# Patient Record
Sex: Female | Born: 1970 | Race: Black or African American | Hispanic: No | Marital: Single
Health system: Midwestern US, Community
[De-identification: ages and names within clinical notes are randomized; demographics above are authoritative.]

## PROBLEM LIST (undated history)

## (undated) DIAGNOSIS — G4730 Sleep apnea, unspecified: Secondary | ICD-10-CM

## (undated) DIAGNOSIS — R569 Unspecified convulsions: Secondary | ICD-10-CM

## (undated) DIAGNOSIS — G35 Multiple sclerosis: Secondary | ICD-10-CM

## (undated) DIAGNOSIS — F319 Bipolar disorder, unspecified: Secondary | ICD-10-CM

## (undated) DIAGNOSIS — F4310 Post-traumatic stress disorder, unspecified: Secondary | ICD-10-CM

## (undated) DIAGNOSIS — G43909 Migraine, unspecified, not intractable, without status migrainosus: Secondary | ICD-10-CM

## (undated) DIAGNOSIS — I10 Essential (primary) hypertension: Secondary | ICD-10-CM

## (undated) HISTORY — PX: BREAST REDUCTION SURGERY: SHX8

## (undated) HISTORY — PX: HEMORRHOID SURGERY: SHX153

## (undated) HISTORY — PX: ABDOMINAL HYSTERECTOMY: SHX81

---

## 2008-05-17 ENCOUNTER — Ambulatory Visit: Payer: Self-pay | Admitting: Family Medicine

## 2008-06-01 ENCOUNTER — Ambulatory Visit: Payer: Self-pay | Admitting: Family Medicine

## 2008-06-01 DIAGNOSIS — E669 Obesity, unspecified: Secondary | ICD-10-CM | POA: Insufficient documentation

## 2008-06-03 ENCOUNTER — Telehealth: Payer: Self-pay | Admitting: Family Medicine

## 2008-06-06 ENCOUNTER — Telehealth: Payer: Self-pay | Admitting: Family Medicine

## 2008-06-07 DIAGNOSIS — D509 Iron deficiency anemia, unspecified: Secondary | ICD-10-CM

## 2008-06-21 ENCOUNTER — Telehealth: Payer: Self-pay | Admitting: Family Medicine

## 2008-06-30 ENCOUNTER — Telehealth: Payer: Self-pay | Admitting: Family Medicine

## 2008-07-01 ENCOUNTER — Telehealth (INDEPENDENT_AMBULATORY_CARE_PROVIDER_SITE_OTHER): Payer: Self-pay | Admitting: *Deleted

## 2008-08-15 ENCOUNTER — Telehealth: Payer: Self-pay | Admitting: Family Medicine

## 2008-08-24 ENCOUNTER — Ambulatory Visit: Payer: Self-pay | Admitting: Family Medicine

## 2008-08-24 ENCOUNTER — Telehealth: Payer: Self-pay | Admitting: Family Medicine

## 2008-08-24 ENCOUNTER — Encounter: Payer: Self-pay | Admitting: Family Medicine

## 2008-08-24 DIAGNOSIS — G43909 Migraine, unspecified, not intractable, without status migrainosus: Secondary | ICD-10-CM | POA: Insufficient documentation

## 2008-08-24 DIAGNOSIS — F3132 Bipolar disorder, current episode depressed, moderate: Secondary | ICD-10-CM | POA: Insufficient documentation

## 2008-08-24 DIAGNOSIS — I10 Essential (primary) hypertension: Secondary | ICD-10-CM

## 2008-08-24 LAB — CONVERTED CEMR LAB
CO2: 23 meq/L (ref 19–32)
Calcium: 9.2 mg/dL (ref 8.4–10.5)
Chloride: 104 meq/L (ref 96–112)
Creatinine, Ser: 0.71 mg/dL (ref 0.40–1.20)
Hemoglobin: 11.9 g/dL — ABNORMAL LOW (ref 12.0–15.0)
Potassium: 3.9 meq/L (ref 3.5–5.3)
RDW: 14.7 % (ref 11.5–15.5)
Sodium: 139 meq/L (ref 135–145)
TSH: 0.992 microintl units/mL (ref 0.350–4.500)

## 2008-08-26 ENCOUNTER — Telehealth: Payer: Self-pay | Admitting: Family Medicine

## 2008-09-06 ENCOUNTER — Encounter: Payer: Self-pay | Admitting: Family Medicine

## 2008-09-06 ENCOUNTER — Telehealth: Payer: Self-pay | Admitting: Family Medicine

## 2008-09-08 ENCOUNTER — Ambulatory Visit: Payer: Self-pay | Admitting: Family Medicine

## 2008-09-16 ENCOUNTER — Telehealth: Payer: Self-pay | Admitting: Family Medicine

## 2008-09-19 ENCOUNTER — Telehealth: Payer: Self-pay | Admitting: Psychology

## 2008-09-19 ENCOUNTER — Encounter: Payer: Self-pay | Admitting: Family Medicine

## 2008-09-19 ENCOUNTER — Ambulatory Visit: Payer: Self-pay | Admitting: Family Medicine

## 2008-09-26 ENCOUNTER — Emergency Department (HOSPITAL_COMMUNITY): Admission: EM | Admit: 2008-09-26 | Discharge: 2008-09-26 | Payer: Self-pay | Admitting: Emergency Medicine

## 2008-09-26 ENCOUNTER — Telehealth: Payer: Self-pay | Admitting: Family Medicine

## 2008-09-28 ENCOUNTER — Telehealth (INDEPENDENT_AMBULATORY_CARE_PROVIDER_SITE_OTHER): Payer: Self-pay | Admitting: *Deleted

## 2008-09-28 ENCOUNTER — Encounter: Payer: Self-pay | Admitting: Family Medicine

## 2008-09-28 ENCOUNTER — Encounter (INDEPENDENT_AMBULATORY_CARE_PROVIDER_SITE_OTHER): Payer: Self-pay | Admitting: *Deleted

## 2008-09-29 ENCOUNTER — Encounter: Payer: Self-pay | Admitting: Family Medicine

## 2008-09-29 ENCOUNTER — Ambulatory Visit: Payer: Self-pay | Admitting: Family Medicine

## 2008-09-29 LAB — CONVERTED CEMR LAB: Lithium Lvl: <0.1 meq/L — ABNORMAL LOW (ref 0.80–1.40)

## 2008-10-10 ENCOUNTER — Telehealth: Payer: Self-pay | Admitting: Psychology

## 2008-10-11 ENCOUNTER — Telehealth: Payer: Self-pay | Admitting: Psychology

## 2008-10-17 ENCOUNTER — Telehealth (INDEPENDENT_AMBULATORY_CARE_PROVIDER_SITE_OTHER): Payer: Self-pay | Admitting: *Deleted

## 2008-10-18 ENCOUNTER — Telehealth (INDEPENDENT_AMBULATORY_CARE_PROVIDER_SITE_OTHER): Payer: Self-pay | Admitting: *Deleted

## 2008-11-03 ENCOUNTER — Ambulatory Visit: Payer: Self-pay | Admitting: Family Medicine

## 2008-11-03 ENCOUNTER — Encounter: Payer: Self-pay | Admitting: Family Medicine

## 2008-11-03 ENCOUNTER — Telehealth (INDEPENDENT_AMBULATORY_CARE_PROVIDER_SITE_OTHER): Payer: Self-pay | Admitting: *Deleted

## 2008-11-03 LAB — CONVERTED CEMR LAB: Lithium Lvl: 0.12 meq/L — ABNORMAL LOW (ref 0.80–1.40)

## 2008-11-04 ENCOUNTER — Telehealth: Payer: Self-pay | Admitting: Family Medicine

## 2008-11-04 ENCOUNTER — Encounter: Payer: Self-pay | Admitting: Family Medicine

## 2008-11-11 ENCOUNTER — Telehealth: Payer: Self-pay | Admitting: Family Medicine

## 2008-11-16 ENCOUNTER — Telehealth: Payer: Self-pay | Admitting: Family Medicine

## 2008-11-18 ENCOUNTER — Ambulatory Visit: Payer: Self-pay | Admitting: Family Medicine

## 2008-12-07 ENCOUNTER — Telehealth: Payer: Self-pay | Admitting: Family Medicine

## 2008-12-27 ENCOUNTER — Encounter: Payer: Self-pay | Admitting: Family Medicine

## 2008-12-27 ENCOUNTER — Ambulatory Visit: Payer: Self-pay | Admitting: Family Medicine

## 2009-01-17 ENCOUNTER — Encounter: Payer: Self-pay | Admitting: Family Medicine

## 2009-01-17 ENCOUNTER — Ambulatory Visit: Payer: Self-pay | Admitting: Family Medicine

## 2009-01-17 LAB — CONVERTED CEMR LAB: Lithium Lvl: 0.16 meq/L — ABNORMAL LOW (ref 0.80–1.40)

## 2009-01-24 ENCOUNTER — Encounter: Payer: Self-pay | Admitting: *Deleted

## 2009-01-27 ENCOUNTER — Telehealth: Payer: Self-pay | Admitting: *Deleted

## 2009-02-01 ENCOUNTER — Ambulatory Visit: Payer: Self-pay | Admitting: Family Medicine

## 2009-02-01 ENCOUNTER — Encounter: Payer: Self-pay | Admitting: Family Medicine

## 2009-02-01 DIAGNOSIS — G609 Hereditary and idiopathic neuropathy, unspecified: Secondary | ICD-10-CM | POA: Insufficient documentation

## 2009-02-01 LAB — CONVERTED CEMR LAB
ALT: 16 units/L (ref 0–35)
Albumin: 4 g/dL (ref 3.5–5.2)
CO2: 24 meq/L (ref 19–32)
Calcium: 9 mg/dL (ref 8.4–10.5)
Chloride: 104 meq/L (ref 96–112)
Creatinine, Ser: 0.84 mg/dL (ref 0.40–1.20)
Total Bilirubin: 0.2 mg/dL — ABNORMAL LOW (ref 0.3–1.2)

## 2009-02-02 ENCOUNTER — Telehealth: Payer: Self-pay | Admitting: *Deleted

## 2009-03-17 ENCOUNTER — Ambulatory Visit: Payer: Self-pay | Admitting: Family Medicine

## 2009-03-17 ENCOUNTER — Encounter: Payer: Self-pay | Admitting: Family Medicine

## 2009-03-17 LAB — CONVERTED CEMR LAB
ALT: 15 units/L (ref 0–35)
AST: 15 units/L (ref 0–37)
Albumin: 4.2 g/dL (ref 3.5–5.2)
Alkaline Phosphatase: 73 units/L (ref 39–117)
BUN: 13 mg/dL (ref 6–23)
Basophils Absolute: 0 10*3/uL (ref 0.0–0.1)
Blood in Urine, dipstick: NEGATIVE
Calcium: 8.9 mg/dL (ref 8.4–10.5)
Creatinine, Ser: 0.69 mg/dL (ref 0.40–1.20)
Eosinophils Absolute: 0.1 10*3/uL (ref 0.0–0.7)
Eosinophils Relative: 1 % (ref 0–5)
Glucose, Urine, Semiquant: NEGATIVE
HCT: 36.5 % (ref 36.0–46.0)
Hemoglobin: 11.4 g/dL — ABNORMAL LOW (ref 12.0–15.0)
MCV: 79.5 fL (ref 78.0–100.0)
Potassium: 4 meq/L (ref 3.5–5.3)
Protein, U semiquant: NEGATIVE
RDW: 15 % (ref 11.5–15.5)
TSH: 1.165 microintl units/mL (ref 0.350–4.500)
Total Protein: 7.1 g/dL (ref 6.0–8.3)
WBC: 7.6 10*3/uL (ref 4.0–10.5)

## 2009-03-21 ENCOUNTER — Telehealth: Payer: Self-pay | Admitting: *Deleted

## 2009-05-26 ENCOUNTER — Ambulatory Visit: Payer: Self-pay | Admitting: Family Medicine

## 2009-05-26 ENCOUNTER — Encounter: Payer: Self-pay | Admitting: Family Medicine

## 2009-05-26 LAB — CONVERTED CEMR LAB: Lithium Lvl: <0.1 meq/L — ABNORMAL LOW (ref 0.80–1.40)

## 2009-05-31 ENCOUNTER — Telehealth: Payer: Self-pay | Admitting: *Deleted

## 2009-06-05 ENCOUNTER — Encounter: Payer: Self-pay | Admitting: Family Medicine

## 2009-06-05 ENCOUNTER — Emergency Department (HOSPITAL_COMMUNITY): Admission: EM | Admit: 2009-06-05 | Discharge: 2009-06-05 | Payer: Self-pay | Admitting: Emergency Medicine

## 2009-07-05 ENCOUNTER — Telehealth: Payer: Self-pay | Admitting: Family Medicine

## 2009-07-07 ENCOUNTER — Telehealth: Payer: Self-pay | Admitting: Family Medicine

## 2009-08-19 ENCOUNTER — Emergency Department (HOSPITAL_COMMUNITY): Admission: EM | Admit: 2009-08-19 | Discharge: 2009-08-19 | Payer: Self-pay | Admitting: Family Medicine

## 2009-12-05 ENCOUNTER — Emergency Department (HOSPITAL_COMMUNITY)
Admission: EM | Admit: 2009-12-05 | Discharge: 2009-12-05 | Payer: Self-pay | Source: Home / Self Care | Admitting: Family Medicine

## 2010-01-02 ENCOUNTER — Ambulatory Visit: Payer: Self-pay | Admitting: Family Medicine

## 2010-01-02 DIAGNOSIS — R358 Other polyuria: Secondary | ICD-10-CM

## 2010-01-02 DIAGNOSIS — R3589 Other polyuria: Secondary | ICD-10-CM | POA: Insufficient documentation

## 2010-01-02 LAB — CONVERTED CEMR LAB
Blood in Urine, dipstick: NEGATIVE
Protein, U semiquant: NEGATIVE
Specific Gravity, Urine: >=1.03
Urobilinogen, UA: 0.2
pH: 5.5

## 2010-03-27 NOTE — Assessment & Plan Note (Signed)
Summary: f/u,df   Vital Signs:  Patient profile:   40 year old female Weight:      229.5 pounds Temp:     98.1 degrees F oral Pulse rate:   79 / minute Pulse rhythm:   regular BP sitting:   119 / 77  (right arm) Cuff size:   large  Vitals Entered By: Loralee Pacas CMA (March 17, 2009 10:37 AM)  Primary Care Provider:  Lequita Asal  MD  CC:  Nausea.  History of Present Illness: 40 y/o female here c/o Nausea/vomiting  HTN- bp stable. denies headaches, chest pain. continued leg pain. on metoprolol  bipolar disorder- not taking lithium appropriately. continues to have long periods of insomnia. works at [employer] some nights for extra money. denies SI/HI. has not presented to [facility] as previously discussed  peripheral neuropathy- w/u negative. symptoms improved but not resolved with neurontin.   Acute Visit History:      The patient complains of abdominal pain, diarrhea, nausea, and vomiting.  These symptoms began 2 weeks ago.  She denies chest pain, constipation, cough, fever, genitourinary symptoms, headache, and rash.  Other comments include: no known sick contacts. no hematemesis, coffee ground emesis, BRBPR, melena. no known GI disease. s/p hysterectomy. denies EtOH intake since New Years Eve. has gained 2 lbs since last visit.        Urine output has been normal.  There have been 3 episodes of vomiting in the past 24 hours.  She is not tolerating clear liquids.        Current Medications (verified): 1)  Metoprolol Tartrate 50 Mg Tabs (Metoprolol Tartrate) .... One Tab By Mouth Bid 2)  Lithium Carbonate 300 Mg Caps (Lithium Carbonate) .... One Tab By Mouth Two Times A Day, and Two Tabs Qhs 3)  Keppra 250 Mg Tabs (Levetiracetam) 4)  Gabapentin 300 Mg  Caps (Gabapentin) .... One Tab By Mouth At Bedtime X1 Week, Then Increase To One Tab By Mouth Two Times A Day X1 Week, Then Increase To One Tab By Mouth Tid 5)  Promethazine Hcl 25 Mg Tabs (Promethazine Hcl) .... 1/2 Tab To 1 Tab  By Mouth Q4-Q6 Hours As Needed For Nausea  Allergies (verified): 1)  ! Penicillin 2)  ! Aspirin 3)  ! * Latex  Physical Exam  General:  obese female NAD; vital signs reviewed.  Mouth:  MMM Lungs:  Normal respiratory effort, chest expands symmetrically. Lungs are clear to auscultation, no crackles or wheezes. Heart:  Normal rate and regular rhythm. S1 and S2 normal without gallop, murmur, click, rub or other extra sounds. Abdomen:  mild TTP diffusely. no rebound or guarding. +BS.  Pulses:  2+ distal pulses Extremities:  TTP of bilateral calves. no significant edema. no palpable cords bilaterally.  Psych:  Oriented X3, normally interactive, good eye contact, not anxious appearing, and not depressed appearing.     Impression & Recommendations:  Problem # 1:  NAUSEA AND VOMITING (ICD-787.01) Assessment New history inconsistent. will order labs. possibly etiologies are vast. phenergan as needed   Orders: Comp Met-[facility] [id number]) Urinalysis-[facility] (00000) CBC w/Diff-[facility] (09811) TSH-[facility] ([id number]) Lipase-[facility] ([id number]) Miscellaneous Lab Charge-[facility] (96295) [facility]- Est  Level 4 (28413)  Problem # 2:  PERIPHERAL NEUROPATHY (ICD-356.9) Assessment: Improved recommend neuro eval. 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 encouraged patient AGAIN to go  to behavioral health.   Orders: [facility]- Est  Level 4 (60454)  Problem # 4:  HYPERTENSION, BENIGN ESSENTIAL (ICD-401.1) Assessment: Unchanged at goal.   Her updated medication list for this problem includes:    Metoprolol Tartrate 50 Mg Tabs (Metoprolol tartrate) ..... One tab by mouth bid  Orders: [facility]- Est  Level 4 (09811) Prescriptions: METRONIDAZOLE 500 MG TABS (METRONIDAZOLE) one tab by mouth two times a day x7 days  #14 x 0   Entered  and Authorized by:   Lequita Asal  MD   Signed by:   Lequita Asal  MD on 03/21/2009   Method used:   Electronically to        [facility] #[id number]* (retail)       [address]       [place], [place]  [address]       Ph: [phone]       Fax: [phone]   RxID:   [id number] PROMETHAZINE HCL 25 MG TABS (PROMETHAZINE HCL) 1/2 tab to 1 tab by mouth q4-q6 hours as needed for nausea  #45 x 0   Entered and Authorized by:   Lequita Asal  MD   Signed by:   Lequita Asal  MD on 03/17/2009   Method used:   Electronically to        [facility] [phone]* (retail)       [address]       [place], [place]  [address]       Ph: [phone]       Fax: [phone]   RxID:   [id number]   Laboratory Results   Urine Tests  Date/Time Received: March 17, 2009 11:17 AM  Date/Time Reported: March 17, 2009 11:50 AM   Routine Urinalysis   Color: yellow Appearance: Clear Glucose: negative   (Normal Range: Negative) Bilirubin: negative   (Normal Range: Negative) Ketone: negative   (Normal Range: Negative) Spec. Gravity: >=1.030   (Normal Range: 1.003-1.035) Blood: negative   (Normal Range: Negative) pH: 5.5   (Normal Range: 5.0-8.0) Protein: negative   (Normal Range: Negative) Urobilinogen: 0.2   (Normal Range: 0-1) Nitrite: negative   (Normal Range: Negative) Leukocyte Esterace: trace   (Normal Range: Negative)  Urine Microscopic WBC/HPF: 1-5 Bacteria/HPF: 2+ Mucous/HPF: 2+ Epithelial/HPF: 15-25 Other: several clue cells present    Comments: ...............test performed by......Marland KitchenBonnie A. [place]az[place], MLS (ASCP)cm      Prevention & Chronic Care Immunizations   Influenza vaccine: Not documented    Tetanus booster: Not documented    Pneumococcal vaccine: Not documented  Other Screening   Pap smear: Not documented   Pap smear action/deferral: Not indicated S/P hysterectomy  (09/06/2008)   Smoking status: never  (01/17/2009)  Lipids    Total Cholesterol: Not documented   LDL: Not documented   LDL Direct: Not documented   HDL: Not documented   Triglycerides: Not documented  Hypertension   Last Blood Pressure: 119 / 77  (03/17/2009)   Serum creatinine: 0.84  (02/01/2009)   Serum potassium 4.3  (02/01/2009) CMP ordered     Hypertension flowsheet reviewed?: Yes   Progress toward BP goal: At goal  Self-Management Support :   Personal Goals (by the next clinic visit) :      Personal blood pressure goal: 140/90  (01/17/2009)   Hypertension self-management support: BP self-monitoring log, Written self-care plan  (01/17/2009)    Hypertension self-management support not done because: Good outcomes  (03/17/2009)

## 2010-03-27 NOTE — Progress Notes (Signed)
Summary: phn msg  Phone Note Call from Patient Call back at Home Phone [phone]   Caller: Patient Summary of Call: Needs to talk to someone about the referral she was given today.   Initial call taken by: Clydell Hakim,  Jul 05, 2009 11:12 AM  Follow-up for Phone Call        to [facility] Follow-up by: Gladstone Pih,  Jul 05, 2009 11:37 AM  Additional Follow-up for Phone Call Additional follow up Details #1::        patient reports she went to her appointment that was scheduled with neurologist today.  she was scheduled to see Dr. Tawanna Solo.    this appointment was scheduled after referral had been faxed to the neurologist office. when she got to appointment today their office realized that she was a previous patient of Dr. Terrace Arabia and would not allow her to Dr. Tawanna Solo. they rescheduled her with Dr. Terrace Arabia for later in the month. she does not want to see Dr. Terrace Arabia  because she never told her why she was having seizures , just told her to take the medicine.  will send message to Dr. Lanier Prude to please advise.  Additional Follow-up by: Theresia Lo RN,  Jul 05, 2009 12:08 PM    Additional Follow-up for Phone Call Additional follow up Details #2::    patient can call and ask to change providers. there isnt anything else we can do. since she was evaluated only once, would recommend she f/u with Dr. Terrace Arabia and address the issues that she had previous visit and ask for better explanations Follow-up by: Lequita Asal  MD,  Jul 05, 2009 12:16 PM  Additional Follow-up for Phone Call Additional follow up Details #3:: Details for Additional Follow-up Action Taken:  patient has already ask to change providers . she had to send in a letter but it was denied. Marland Kitchen gave patient message from Dr. Lanier Prude. RN strongly encouraged her to follow up with Dr. Pincus Sanes. she will consider and let us know. Additional Follow-up by: Theresia Lo RN,  Jul 05, 2009 12:23 PM

## 2010-03-27 NOTE — Assessment & Plan Note (Signed)
Summary: f/up,tcb   Vital Signs:  Patient profile:   40 year old female Weight:      228.9 pounds Temp:     98.3 degrees F oral Pulse rate:   90 / minute Pulse rhythm:   regular BP sitting:   137 / 90  (left arm) Cuff size:   large  Vitals Entered By: Loralee Pacas CMA (May 26, 2009 8:52 AM)  Primary Care Provider:  Lequita Asal  MD  CC:  f/u Bipolar disorder and HTN.  History of Present Illness: 40 y/o female here for f/u  HTN- bp stable. denies headaches, chest pain. continued leg pain. on metoprolol  bipolar disorder- not taking lithium appropriately. continues to have long periods of insomnia. works at [employer] some nights for extra money. denies SI/HI. has not presented to [facility] as previously discussed  ?seizures- seizure activity has decreased but still persists. last seizure 2 weeks ago. pt no loner driving. seen by [facility] and reports abnormal EEG. started on Keppra. has not been seen by neuro since initial eval. no consciousness of events. episodes per son with jerking movements of arms/legs. some confusion afterwards. sister with epilepsy.  Current Medications (verified): 1)  Metoprolol Tartrate 50 Mg Tabs (Metoprolol Tartrate) .... One Tab By Mouth Bid 2)  Lithium Carbonate 300 Mg Caps (Lithium Carbonate) .... One Tab By Mouth Two Times A Day, and Two Tabs Qhs 3)  Keppra 250 Mg Tabs (Levetiracetam) 4)  Gabapentin 300 Mg  Caps (Gabapentin) .... One Tab By Mouth At Bedtime X1 Week, Then Increase To One Tab By Mouth Two Times A Day X1 Week, Then Increase To One Tab By Mouth Tid 5)  Promethazine Hcl 25 Mg Tabs (Promethazine Hcl) .... 1/2 Tab To 1 Tab By Mouth Q4-Q6 Hours As Needed For Nausea  Allergies (verified): 1)  ! Penicillin 2)  ! Aspirin 3)  ! 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  She lives with her 29 year old son, and 43 year old daughter Andreal, [place]).  She enjoys exercising, traveling, and dancing.  Physical Exam  General:  obese female NAD; vital signs reviewed.  Mouth:  MMM Lungs:  Normal respiratory effort, chest expands symmetrically. Lungs are clear to auscultation, no crackles or wheezes. Heart:  Normal rate and regular rhythm. S1 and S2 normal without gallop, murmur, click, rub or other extra sounds. Pulses:  2+ distal pulses Extremities:  no edema of BLE Neurologic:  alert & oriented X3, cranial nerves II-XII intact, strength normal in all extremities, and gait normal.   Psych:  Oriented X3, normally interactive, good eye contact, and not depressed appearing.     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 concerned patient still not taking medications appropriately. recommend, again, eval by psychiatry.   Orders: [facility]- Est  Level 4 (82956)  Problem # 2:  HYPERTENSION, BENIGN ESSENTIAL (ICD-401.1) Assessment: Unchanged no changes.   Her updated medication list for this problem includes:    Metoprolol Tartrate 100 Mg Tabs (Metoprolol tartrate) ..... One tab by mouth bid  Orders: [facility]- Est  Level 4 (21308)  Problem # 3:  ? of SEIZURE DISORDER (ICD-780.39) Assessment: Unchanged check keppra level. will refer back to neuro.   Her updated medication list for this problem includes:    Keppra 250 Mg Tabs (  Levetiracetam)    Gabapentin 300 Mg Caps (Gabapentin) ..... One tab by mouth at bedtime x1 week, then increase to one tab by mouth two times a day x1 week, then increase to one tab by mouth tid  Orders: Neurology Referral (Neuro) [facility]- Est  Level 4 (13086)  Complete Medication List: 1)  Metoprolol Tartrate 100 Mg Tabs (Metoprolol tartrate) .... One tab by mouth bid 2)  Lithium Carbonate 300 Mg Caps (Lithium carbonate) .... One tab by mouth two times a day, and two tabs qhs 3)  Keppra 250 Mg Tabs  (Levetiracetam) 4)  Gabapentin 300 Mg Caps (Gabapentin) .... One tab by mouth at bedtime x1 week, then increase to one tab by mouth two times a day x1 week, then increase to one tab by mouth tid 5)  Promethazine Hcl 25 Mg Tabs (Promethazine hcl) .... 1/2 tab to 1 tab by mouth q4-q6 hours as needed for nausea  Other Orders: Miscellaneous Lab Charge-[facility] (57846) Miscellaneous Lab Charge-[facility] [phone]) Prescriptions: METOPROLOL TARTRATE 100 MG TABS (METOPROLOL TARTRATE) one tab by mouth bid  #60 x 3   Entered and Authorized by:   Lequita Asal  MD   Signed by:   Lequita Asal  MD on 05/26/2009   Method used:   Electronically to        [facility] [phone]* (retail)       [address]       [place], [place]  [address]       Ph: [phone]       Fax: [phone]   RxID:   [id number]    Prevention & Chronic Care Immunizations   Influenza vaccine: Not documented    Tetanus booster: Not documented    Pneumococcal vaccine: Not documented  Other Screening   Pap smear: Not documented   Pap smear action/deferral: Not indicated S/P hysterectomy  (09/06/2008)   Smoking status: never  (01/17/2009)  Lipids   Total Cholesterol: Not documented   LDL: Not documented   LDL Direct: Not documented   HDL: Not documented   Triglycerides: Not documented  Hypertension   Last Blood Pressure: 137 / 90  (05/26/2009)   Serum creatinine: 0.69  (03/17/2009)   Serum potassium 4.0  (03/17/2009)    Hypertension flowsheet reviewed?: Yes   Progress toward BP goal: At goal  Self-Management Support :   Personal Goals (by the next clinic visit) :      Personal blood pressure goal: 140/90  (01/17/2009)   Patient will work on the following items until the next clinic visit to reach self-care goals:     Medications and monitoring: take my medicines every day  (05/26/2009)     Eating: eat foods that are low in salt  (05/26/2009)     Activity: take a 30 minute walk every day   (05/26/2009)    Hypertension self-management support: BP self-monitoring log, Written self-care plan  (01/17/2009)    Hypertension self-management support not done because: Good outcomes  (05/26/2009)

## 2010-03-27 NOTE — Assessment & Plan Note (Signed)
Summary: bipolar disorder   Vital Signs:  Patient profile:   40 year old female Height:      67 inches Weight:      220.13 pounds BMI:     34.60 BSA:     2.11 Temp:     98.6 degrees F Pulse rate:   86 / minute BP sitting:   118 / 78  Vitals Entered By: Delora Fuel (January 02, 2010 10:42 AM) CC: mulitiple issues Is Patient Diabetic? No Pain Assessment Patient in pain? yes     Location: all over Intensity: 5   Primary Care Provider:  Lequita Asal  MD  CC:  mulitiple issues.  History of Present Illness: This patient came in today for refill on her Lithium, she gives a very sketchy history regarding who has been prescribing this for her, on one occasion said our office, on another she said a psychiatrist in the [place] system (she is a reserve Administrator, [employer]).  She is also upset that she was not scheduled with a MD. On review of her records, she last had Lithium filled here one year ago, with QS for one month and one refill.  Her levels in the past indicated poor adhearance.  Her past MD indicated in her summary a similar pattern.  She has a history of seizures and did not get along with her neurologist and did not pay her bills per her records.  She is on two anticonvulsants that have also not been refilled by our office.  Today I discussed with her the importance of her seeing a psychiatrist to diagnose and follow her mediations.  That I was willing to refill her anticonvulsants but not her LIthium, as I got a urine on her and she was very dehydrated.  I wanted to draw labs and she refused saying that she would go to another doctor.  She was offered names of psychiatrists.  Habits & Providers  Alcohol-Tobacco-Diet     Alcohol drinks/day: <1     Alcohol Counseling: not indicated; patient does not drink     Alcohol type: wine     >5/day in last 3 mos: no     Feels need to cut down: no     Feels annoyed by complaints: no     Feels guilty re: drinking: no     Needs 'eye opener'  in am: no     Tobacco Status: never     Tobacco Counseling: not indicated; no tobacco use  Exercise-Depression-Behavior     Have you felt down or hopeless? yes     Have you felt little pleasure in things? yes     Depression Counseling: further diagnostic testing and/or other treatment is indicated  Allergies: 1)  ! Penicillin 2)  ! Aspirin 3)  ! 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  She works part-time at a [employer] given her frequent insomnia. She lives with her 34 year old son, and 92 year old daughter Lyrah, [place]).  She enjoys exercising, traveling, and dancing.  Physical Exam  General:  Patient was alert and well groomed, she was upset when I refused to refill her lithium without psychiatric referral.     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  She reported 2 weeks of mania, and past 2 days of depression.  Her urine was very concentrated, with concern for DI or other causes of dehydration which would cause lithium toxicity.  I did not feel that it was safe to practice ouside my scope, she has excellent insurance, she refused blood work to work up for [employer], refused a referral, and left angry. Orders: Miscellaneous Lab Charge-[facility] [id number]) [facility]- Est Level  3 (60454)  Complete Medication List: 1)  Metoprolol Tartrate 100 Mg Tabs (Metoprolol tartrate) .... One tab by mouth bid 2)  Lithium Carbonate 300 Mg Caps (Lithium carbonate) .... One tab by mouth two times a day, and two tabs qhs 3)  Keppra 250 Mg Tabs (Levetiracetam) 4)  Gabapentin 300 Mg Caps (Gabapentin) .... One tab by mouth at bedtime x1 week, then increase to one tab by mouth two times a day x1 week, then  increase to one tab by mouth tid 5)  Promethazine Hcl 25 Mg Tabs (Promethazine hcl) .... 1/2 tab to 1 tab by mouth q4-q6 hours as needed for nausea  Other Orders: Urinalysis-[facility] (00000)   Orders Added: 1)  Urinalysis-[facility] [00000] 2)  Miscellaneous Lab Charge-[facility] [99999] 3)  [facility]- Est Level  3 [99213]    Laboratory Results   Urine Tests  Date/Time Received: January 02, 2010 10:55 AM  Date/Time Reported: January 02, 2010 11:06 AM   Routine Urinalysis   Color: yellow Appearance: Clear Glucose: negative   (Normal Range: Negative) Bilirubin: negative   (Normal Range: Negative) Ketone: negative   (Normal Range: Negative) Spec. Gravity: >=1.030   (Normal Range: 1.003-1.035) Blood: negative   (Normal Range: Negative) pH: 5.5   (Normal Range: 5.0-8.0) Protein: negative   (Normal Range: Negative) Urobilinogen: 0.2   (Normal Range: 0-1) Nitrite: negative   (Normal Range: Negative) Leukocyte Esterace: negative   (Normal Range: Negative)    Comments: ...............test performed by......Marland KitchenBonnie A. Swazil[place], MLS (ASCP)cm

## 2010-03-27 NOTE — Letter (Signed)
Summary: Out of Work  [facility]  [address]   [place], [place] [address]   Phone: [phone]  Fax: [phone]    March 17, 2009   Employee:  [name]    To Whom It May Concern:   For Medical reasons, the above named employee should not participate in activities on the gun range.  If you need additional information, please feel free to contact our office.         Sincerely,    Lequita Asal  MD

## 2010-03-27 NOTE — Letter (Signed)
Summary: Work Excuse  [facility]  [address]   [place], [place] [address]   Phone: [phone]  Fax: [phone]    Today's Date: May 26, 2009  Name of Patient: Tina Wright  The above named patient had a medical visit today at:  9 am  Please take this into consideration when reviewing the time away from work/school.    Special Instructions:  [  ] None  [  ] To be off the remainder of today, returning to the normal work / school schedule tomorrow.  [  ] To be off until the next scheduled appointment on ______________________.  [x ] Other __NO PT OR FIRING UNTIL CLEARED BY NEUROLOGIST                          ________________________________________________________________________   Sincerely yours,   Lequita Asal  MD

## 2010-03-27 NOTE — Progress Notes (Signed)
Summary: pls call  Phone Note Call from Patient Call back at Home Phone [phone]   Caller: Patient Summary of Call: needs to talk to Dr Lanier Prude - it's very important Initial call taken by: De Nurse,  Jul 07, 2009 3:40 PM  Follow-up for Phone Call        "having a meltdown." unable to present to [facility] because she is concerned about son and can't leave him and he has EOGs. reports suicidal ideation without plan. advised again to present to [facility] and she states she contracts for safety and will call someone if she has a plan to hurt herself.  Follow-up by: Lequita Asal  MD,  Jul 07, 2009 3:44 PM

## 2010-03-27 NOTE — Progress Notes (Signed)
  Phone Note Outgoing Call   Call placed by: Lequita Asal  MD,  May 31, 2009 12:33 PM Summary of Call: sent gel for daughters acne to [facility] [address]. please inform.  Initial call taken by: Lequita Asal  MD,  May 31, 2009 12:33 PM      pt notified.Loralee Pacas CMA  June 02, 2009 2:17 PM

## 2010-03-27 NOTE — Progress Notes (Signed)
Summary: normal labs  Phone Note Outgoing Call   Call placed by: Lequita Asal  MD,  March 21, 2009 1:10 PM Summary of Call: please inform labs normal, except for bacterial vaginosis on U/A. rx for flagyl sent.  Initial call taken by: Lequita Asal  MD,  March 21, 2009 1:10 PM    called pt and informed her of her results and told her that she can go pick up her Rx at [facility] [address].Loralee Pacas CMA  March 21, 2009 5:07 PM

## 2010-03-27 NOTE — Miscellaneous (Signed)
Summary: re: neuro appointment  Clinical Lists Changes   attempted to schedule appointment with [facility]. patient has a balance there and will need to contact them  before she can be seen. patient notified.  ask her to call me back when this has been done so I can proceed with referral. Theresia Lo RN  June 05, 2009 3:10 PM

## 2010-04-05 ENCOUNTER — Encounter: Payer: Self-pay | Admitting: *Deleted

## 2010-05-16 LAB — LITHIUM LEVEL: Lithium Lvl: <0.25 mEq/L — ABNORMAL LOW (ref 0.80–1.40)

## 2010-05-16 LAB — COMPREHENSIVE METABOLIC PANEL
ALT: 13 U/L (ref 0–35)
Alkaline Phosphatase: 58 U/L (ref 39–117)
BUN: 10 mg/dL (ref 6–23)
GFR calc Af Amer: >60 mL/min (ref >60)
GFR calc non Af Amer: >60 mL/min (ref >60)
Potassium: 3.8 mEq/L (ref 3.5–5.1)

## 2010-05-16 LAB — CBC
HCT: 35.3 % — ABNORMAL LOW (ref 36.0–46.0)
RBC: 4.46 MIL/uL (ref 3.87–5.11)
RDW: 14.7 % (ref 11.5–15.5)
WBC: 7.1 10*3/uL (ref 4.0–10.5)

## 2010-05-16 LAB — TROPONIN I: Troponin I: 0.01 ng/mL (ref 0.00–0.06)

## 2010-06-02 LAB — DIFFERENTIAL
Basophils Relative: 0 % (ref 0–1)
Eosinophils Absolute: 0.1 10*3/uL (ref 0.0–0.7)
Eosinophils Relative: 1 % (ref 0–5)
Monocytes Absolute: 0.4 10*3/uL (ref 0.1–1.0)
Neutrophils Relative %: 59 % (ref 43–77)

## 2010-06-02 LAB — COMPREHENSIVE METABOLIC PANEL
ALT: 17 U/L (ref 0–35)
AST: 19 U/L (ref 0–37)
Albumin: 3.5 g/dL (ref 3.5–5.2)
BUN: 9 mg/dL (ref 6–23)
CO2: 27 mEq/L (ref 19–32)
Calcium: 8.7 mg/dL (ref 8.4–10.5)
Creatinine, Ser: 0.71 mg/dL (ref 0.4–1.2)
GFR calc non Af Amer: >60 mL/min (ref >60)
Potassium: 3.7 mEq/L (ref 3.5–5.1)

## 2010-06-02 LAB — RAPID URINE DRUG SCREEN, HOSP PERFORMED
Amphetamines: NOT DETECTED
Cocaine: NOT DETECTED
Opiates: NOT DETECTED
Tetrahydrocannabinol: NOT DETECTED

## 2010-06-02 LAB — CBC
MCHC: 32.8 g/dL (ref 30.0–36.0)
Platelets: 285 10*3/uL (ref 150–400)
RBC: 4.13 MIL/uL (ref 3.87–5.11)

## 2010-07-25 ENCOUNTER — Encounter: Payer: Self-pay | Admitting: Family Medicine

## 2010-07-25 ENCOUNTER — Ambulatory Visit (INDEPENDENT_AMBULATORY_CARE_PROVIDER_SITE_OTHER): Payer: Self-pay | Admitting: Family Medicine

## 2010-07-25 DIAGNOSIS — Z202 Contact with and (suspected) exposure to infections with a predominantly sexual mode of transmission: Secondary | ICD-10-CM

## 2010-07-25 DIAGNOSIS — N760 Acute vaginitis: Secondary | ICD-10-CM

## 2010-07-25 LAB — POCT WET PREP (WET MOUNT)

## 2010-07-25 NOTE — Patient Instructions (Signed)
Follow up as needed.  I will call with results.

## 2010-07-25 NOTE — Progress Notes (Signed)
  Subjective:    Patient ID: Tina Wright, female    DOB: 04/15/1970, 40 y.o.   MRN: [id number]  HPI  1) STD test: Boyfriend's mistress called on Sunday to state that she had Chlamydia and had contracted it from patient's boyfriend. Reports some whitish discharge intermittently without odor. Denies dysuria, hematuria, pelvic pain, nausea, emesis, fever, chills, weight loss.    Review of Systems As per HPI     Objective:   Physical Exam  Constitutional: She appears well-developed and well-nourished.  Abdominal: Soft. Bowel sounds are normal. There is no tenderness. There is no rebound and no guarding.  Genitourinary: Cervix exhibits discharge. Cervix exhibits no motion tenderness. Vaginal discharge found.          Assessment & Plan:

## 2010-07-25 NOTE — Assessment & Plan Note (Addendum)
Declines testing for HIV, syphilis, hepatitis. Wet prep, GC/CT done today. Will call patient with results - negative for trichomonas, GC/CT. Positive for bacterial vaginosis. Will send in treatment - Flagyl BID. Called patient to inform.

## 2010-07-26 MED ORDER — METRONIDAZOLE 500 MG PO TABS: 500.0000 mg | ORAL_TABLET | Freq: Two times a day (BID) | ORAL | Status: AC

## 2010-07-26 NOTE — Progress Notes (Signed)
Addended by: Gennette Pac on: 07/26/2010 02:04 PM   Modules accepted: Orders

## 2010-10-30 ENCOUNTER — Emergency Department (HOSPITAL_COMMUNITY): Payer: Self-pay

## 2010-10-30 ENCOUNTER — Emergency Department (HOSPITAL_COMMUNITY)
Admission: EM | Admit: 2010-10-30 | Discharge: 2010-10-30 | Disposition: A | Discharge status: Home / Self Care | Payer: Self-pay | Attending: Emergency Medicine | Admitting: Emergency Medicine

## 2010-10-30 DIAGNOSIS — D649 Anemia, unspecified: Secondary | ICD-10-CM | POA: Insufficient documentation

## 2010-10-30 DIAGNOSIS — S5010XA Contusion of unspecified forearm, initial encounter: Secondary | ICD-10-CM | POA: Insufficient documentation

## 2010-10-30 DIAGNOSIS — G40909 Epilepsy, unspecified, not intractable, without status epilepticus: Secondary | ICD-10-CM | POA: Insufficient documentation

## 2010-10-30 DIAGNOSIS — W1809XA Striking against other object with subsequent fall, initial encounter: Secondary | ICD-10-CM | POA: Insufficient documentation

## 2010-10-30 DIAGNOSIS — I10 Essential (primary) hypertension: Secondary | ICD-10-CM | POA: Insufficient documentation

## 2010-11-06 ENCOUNTER — Emergency Department (HOSPITAL_COMMUNITY)
Admission: EM | Admit: 2010-11-06 | Discharge: 2010-11-06 | Disposition: A | Discharge status: Home / Self Care | Payer: Self-pay | Attending: Emergency Medicine | Admitting: Emergency Medicine

## 2010-11-06 ENCOUNTER — Emergency Department (HOSPITAL_COMMUNITY): Payer: Self-pay

## 2010-11-06 DIAGNOSIS — F4310 Post-traumatic stress disorder, unspecified: Secondary | ICD-10-CM | POA: Insufficient documentation

## 2010-11-06 DIAGNOSIS — F329 Major depressive disorder, single episode, unspecified: Secondary | ICD-10-CM | POA: Insufficient documentation

## 2010-11-06 DIAGNOSIS — I10 Essential (primary) hypertension: Secondary | ICD-10-CM | POA: Insufficient documentation

## 2010-11-06 DIAGNOSIS — R42 Dizziness and giddiness: Secondary | ICD-10-CM | POA: Insufficient documentation

## 2010-11-06 DIAGNOSIS — G40909 Epilepsy, unspecified, not intractable, without status epilepticus: Secondary | ICD-10-CM | POA: Insufficient documentation

## 2010-11-06 DIAGNOSIS — F3289 Other specified depressive episodes: Secondary | ICD-10-CM | POA: Insufficient documentation

## 2010-11-06 DIAGNOSIS — R51 Headache: Secondary | ICD-10-CM | POA: Insufficient documentation

## 2010-11-06 LAB — POCT I-STAT, CHEM 8
BUN: 6 mg/dL (ref 6–23)
Calcium, Ion: 1.15 mmol/L (ref 1.12–1.32)
Chloride: 104 meq/L (ref 96–112)
Creatinine, Ser: 0.9 mg/dL (ref 0.50–1.10)
Glucose, Bld: 89 mg/dL (ref 70–99)
HCT: 37 % (ref 36.0–46.0)
Hemoglobin: 12.6 g/dL (ref 12.0–15.0)
Potassium: 3.9 meq/L (ref 3.5–5.1)
Sodium: 139 meq/L (ref 135–145)
TCO2: 24 mmol/L (ref 0–100)

## 2010-11-06 LAB — URINALYSIS, ROUTINE W REFLEX MICROSCOPIC
Glucose, UA: NEGATIVE mg/dL
Leukocytes, UA: NEGATIVE
Nitrite: NEGATIVE
Protein, ur: NEGATIVE mg/dL
Urobilinogen, UA: 1 mg/dL (ref 0.0–1.0)

## 2010-11-06 LAB — RAPID URINE DRUG SCREEN, HOSP PERFORMED: Opiates: NOT DETECTED

## 2010-11-07 LAB — GLUCOSE, CAPILLARY: Glucose-Capillary: 112 mg/dL — ABNORMAL HIGH (ref 70–99)

## 2011-03-14 ENCOUNTER — Ambulatory Visit: Payer: Self-pay | Admitting: Family Medicine

## 2012-01-01 ENCOUNTER — Ambulatory Visit: Payer: Self-pay

## 2012-01-07 ENCOUNTER — Ambulatory Visit: Payer: Self-pay | Admitting: Family Medicine

## 2012-05-18 ENCOUNTER — Encounter (HOSPITAL_COMMUNITY): Payer: Self-pay | Admitting: *Deleted

## 2012-05-18 ENCOUNTER — Emergency Department (HOSPITAL_COMMUNITY)
Admission: EM | Admit: 2012-05-18 | Discharge: 2012-05-18 | Disposition: A | Discharge status: Home / Self Care | Payer: Non-veteran care | Attending: Emergency Medicine | Admitting: Emergency Medicine

## 2012-05-18 ENCOUNTER — Emergency Department (HOSPITAL_COMMUNITY): Payer: Non-veteran care

## 2012-05-18 DIAGNOSIS — G4730 Sleep apnea, unspecified: Secondary | ICD-10-CM | POA: Insufficient documentation

## 2012-05-18 DIAGNOSIS — R209 Unspecified disturbances of skin sensation: Secondary | ICD-10-CM | POA: Insufficient documentation

## 2012-05-18 DIAGNOSIS — Z8659 Personal history of other mental and behavioral disorders: Secondary | ICD-10-CM | POA: Insufficient documentation

## 2012-05-18 DIAGNOSIS — F319 Bipolar disorder, unspecified: Secondary | ICD-10-CM | POA: Insufficient documentation

## 2012-05-18 DIAGNOSIS — G40909 Epilepsy, unspecified, not intractable, without status epilepticus: Secondary | ICD-10-CM | POA: Insufficient documentation

## 2012-05-18 DIAGNOSIS — M79609 Pain in unspecified limb: Secondary | ICD-10-CM | POA: Insufficient documentation

## 2012-05-18 DIAGNOSIS — G35 Multiple sclerosis: Secondary | ICD-10-CM | POA: Insufficient documentation

## 2012-05-18 DIAGNOSIS — Z79899 Other long term (current) drug therapy: Secondary | ICD-10-CM | POA: Insufficient documentation

## 2012-05-18 DIAGNOSIS — I10 Essential (primary) hypertension: Secondary | ICD-10-CM | POA: Insufficient documentation

## 2012-05-18 HISTORY — DX: Essential (primary) hypertension: I10

## 2012-05-18 HISTORY — DX: Multiple sclerosis: G35

## 2012-05-18 HISTORY — DX: Bipolar disorder, unspecified: F31.9

## 2012-05-18 HISTORY — DX: Post-traumatic stress disorder, unspecified: F43.10

## 2012-05-18 HISTORY — DX: Unspecified convulsions: R56.9

## 2012-05-18 HISTORY — DX: Sleep apnea, unspecified: G47.30

## 2012-05-18 LAB — URINALYSIS, ROUTINE W REFLEX MICROSCOPIC
Bilirubin Urine: NEGATIVE
Glucose, UA: NEGATIVE mg/dL
Ketones, ur: NEGATIVE mg/dL
Leukocytes, UA: NEGATIVE
Protein, ur: NEGATIVE mg/dL

## 2012-05-18 LAB — COMPREHENSIVE METABOLIC PANEL
ALT: 14 U/L (ref 0–35)
Albumin: 3.5 g/dL (ref 3.5–5.2)
Alkaline Phosphatase: 73 U/L (ref 39–117)
BUN: 10 mg/dL (ref 6–23)
Chloride: 104 mEq/L (ref 96–112)
GFR calc Af Amer: >90 mL/min (ref >90)
Glucose, Bld: 94 mg/dL (ref 70–99)
Potassium: 4 mEq/L (ref 3.5–5.1)
Sodium: 136 mEq/L (ref 135–145)
Total Bilirubin: 0.2 mg/dL — ABNORMAL LOW (ref 0.3–1.2)

## 2012-05-18 LAB — CBC WITH DIFFERENTIAL/PLATELET
Hemoglobin: 11.3 g/dL — ABNORMAL LOW (ref 12.0–15.0)
Lymphs Abs: 2.6 10*3/uL (ref 0.7–4.0)
Monocytes Relative: 6 % (ref 3–12)
Neutro Abs: 5.6 10*3/uL (ref 1.7–7.7)
Neutrophils Relative %: 63 % (ref 43–77)
Platelets: 301 10*3/uL (ref 150–400)
RBC: 4.4 MIL/uL (ref 3.87–5.11)
WBC: 8.9 10*3/uL (ref 4.0–10.5)

## 2012-05-18 LAB — GLUCOSE, CAPILLARY

## 2012-05-18 MED ORDER — HYDROMORPHONE HCL PF 1 MG/ML IJ SOLN
1.0000 mg | Freq: Once | INTRAMUSCULAR | Status: AC
  Administered 2012-05-18: 1 mg via INTRAVENOUS
  Filled 2012-05-18: qty 1

## 2012-05-18 MED ORDER — ONDANSETRON HCL 4 MG/2ML IJ SOLN
4.0000 mg | Freq: Once | INTRAMUSCULAR | Status: AC
  Administered 2012-05-18: 4 mg via INTRAVENOUS
  Filled 2012-05-18: qty 2

## 2012-05-18 NOTE — ED Notes (Signed)
Patient denies chest pain now.  Alert and oriented, takes Keppra for seizures but also has bipolar disorder, PTSD, and MS.  Patient reports she usually can tell when her seizures are coming on.  She has petit mal and usually does not go unconscious.  Pt reports this has been the second time she has lost conciousness in the last six months.  She sees a neurologist at the [facility] in [place] and one in [place].  Patient is compliant with all her meds.

## 2012-05-18 NOTE — ED Provider Notes (Signed)
History    CSN: [id number] Arrival date & time 05/18/12  1944 First MD Initiated Contact with Patient 05/18/12 2110      Chief Complaint  Patient presents with  . Loss of Consciousness    HPI Pt had an episode of loss of consciousness.  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  She remembers being at the desk.  The next thing she remembers is being roused by the people at the facility.  She has history of ms and absence seizures.  She has been taking all of her medications. She now feels like her vision is off and she is confused.  She was not seen for 30 minutes.  Co-workers thought she was just staring off initially.    Now she also has pain in the left arm and leg.  Her face is also tingling.  No trouble with speech.  She does have a headache.  No vomiting. Past Medical History  Diagnosis Date  . Seizure   . Bipolar 1 disorder   . PTSD (post-traumatic stress disorder)   . Multiple sclerosis   . Sleep apnea   . Hypertension     Past Surgical History  Procedure Laterality Date  . Abdominal hysterectomy    . Cesarean section    . Hemorrhoid surgery    . Breast reduction surgery      No family history on file.  History  Substance Use Topics  . Smoking status: Never Smoker   . Smokeless tobacco: Never Used  . Alcohol Use: Yes    OB History   Grav Para Term Preterm Abortions TAB SAB Ect Mult Living                  Review of Systems  All other systems reviewed and are negative.    Allergies  Aspirin; Latex; and Penicillins  Home Medications   Current Outpatient Rx  Name  Route  Sig  Dispense  Refill  . ibuprofen (ADVIL,MOTRIN) 200 MG tablet   Oral   Take 400 mg by mouth every 8 (eight) hours as needed for pain.         Marland Kitchen levETIRAcetam (KEPPRA) 250 MG tablet   Oral   Take 500 mg by mouth 3 (three) times daily.          Marland Kitchen lithium carbonate 300 MG capsule   Oral   Take 300-600 mg by mouth 3 (three) times daily. 1 cap bid and 2 caps qhs         .  metoprolol (LOPRESSOR) 100 MG tablet   Oral   Take 100 mg by mouth 2 (two) times daily.           . promethazine (PHENERGAN) 25 MG tablet   Oral   Take 12.5-25 mg by mouth every 6 (six) hours as needed for nausea.            BP 132/90  Pulse 61  Temp(Src) 98.2 F (36.8 C)  Resp 20  SpO2 99%  Physical Exam  Nursing note and vitals reviewed. Constitutional: She appears well-developed and well-nourished. No distress.  HENT:  Head: Normocephalic and atraumatic.  Right Ear: External ear normal.  Left Ear: External ear normal.  Eyes: Conjunctivae are normal. Right eye exhibits no discharge. Left eye exhibits no discharge. No scleral icterus.  Neck: Neck supple. No tracheal deviation present.  Cardiovascular: Normal rate, regular rhythm and intact distal pulses.   Pulmonary/Chest: Effort normal and breath sounds normal. No stridor.  No respiratory distress. She has no wheezes. She has no rales.  Abdominal: Soft. Bowel sounds are normal. She exhibits no distension. There is no tenderness. There is no rebound and no guarding.  Musculoskeletal: She exhibits no edema and no tenderness.  Neurological: She is alert. She has normal strength. No sensory deficit. Cranial nerve deficit:  no gross defecits noted. She exhibits normal muscle tone. She displays no seizure activity. Coordination normal.  5/5 grip strength, 5/5 lower extrem strength, sensation intact throughout  Skin: Skin is warm and dry. No rash noted.  Psychiatric: She has a normal mood and affect.    ED Course  Procedures (including critical care time)  Labs Reviewed  CBC WITH DIFFERENTIAL - Abnormal; Notable for the following:    Hemoglobin 11.3 (*)    HCT 34.9 (*)    MCH 25.7 (*)    All other components within normal limits  COMPREHENSIVE METABOLIC PANEL - Abnormal; Notable for the following:    Total Bilirubin 0.2 (*)    All other components within normal limits  URINALYSIS, ROUTINE W REFLEX MICROSCOPIC - Abnormal;  Notable for the following:    APPearance CLOUDY (*)    All other components within normal limits  GLUCOSE, CAPILLARY   Ct Head Wo Contrast  05/18/2012  *RADIOLOGY REPORT*  Clinical Data: petit mal seizure.  Loss of consciousness.  CT HEAD WITHOUT CONTRAST  Technique:  Contiguous axial images were obtained from the base of the skull through the vertex without contrast.  Comparison: 11/06/2010.  Findings: No mass lesion, mass effect, midline shift, hydrocephalus, hemorrhage.  No territorial ischemia or acute infarction.  Scalp calcifications are present, chronic.  Paranasal sinuses are within normal limits.  IMPRESSION: Negative CT head.   Original Report Authenticated By: Andreas Newport, M.D.      1. Seizure disorder       MDM  Pt has history of absence seizures.  Doubt cardiac etiology.  No active bleeding.  Doubt infection. Vital signs normal.  Most likely etiology for her symptoms today.  Pt has been monitored in the ED.  NO recurrent episodes.  Will dc home.  Recommend follow up with her neurologist.        Celene Kras, MD 05/18/12 2333

## 2012-05-18 NOTE — ED Notes (Signed)
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

## 2012-05-23 ENCOUNTER — Emergency Department (HOSPITAL_COMMUNITY): Payer: Non-veteran care

## 2012-05-23 ENCOUNTER — Encounter (HOSPITAL_COMMUNITY): Payer: Self-pay | Admitting: Emergency Medicine

## 2012-05-23 ENCOUNTER — Emergency Department (HOSPITAL_COMMUNITY)
Admission: EM | Admit: 2012-05-23 | Discharge: 2012-05-23 | Disposition: A | Discharge status: Home / Self Care | Payer: Non-veteran care | Attending: Emergency Medicine | Admitting: Emergency Medicine

## 2012-05-23 DIAGNOSIS — H538 Other visual disturbances: Secondary | ICD-10-CM | POA: Insufficient documentation

## 2012-05-23 DIAGNOSIS — Z3202 Encounter for pregnancy test, result negative: Secondary | ICD-10-CM | POA: Insufficient documentation

## 2012-05-23 DIAGNOSIS — R202 Paresthesia of skin: Secondary | ICD-10-CM

## 2012-05-23 DIAGNOSIS — R51 Headache: Secondary | ICD-10-CM

## 2012-05-23 DIAGNOSIS — Z8669 Personal history of other diseases of the nervous system and sense organs: Secondary | ICD-10-CM | POA: Insufficient documentation

## 2012-05-23 DIAGNOSIS — Z8679 Personal history of other diseases of the circulatory system: Secondary | ICD-10-CM | POA: Insufficient documentation

## 2012-05-23 DIAGNOSIS — R209 Unspecified disturbances of skin sensation: Secondary | ICD-10-CM | POA: Insufficient documentation

## 2012-05-23 DIAGNOSIS — I10 Essential (primary) hypertension: Secondary | ICD-10-CM | POA: Insufficient documentation

## 2012-05-23 DIAGNOSIS — R200 Anesthesia of skin: Secondary | ICD-10-CM

## 2012-05-23 DIAGNOSIS — F319 Bipolar disorder, unspecified: Secondary | ICD-10-CM | POA: Insufficient documentation

## 2012-05-23 DIAGNOSIS — G40909 Epilepsy, unspecified, not intractable, without status epilepticus: Secondary | ICD-10-CM | POA: Insufficient documentation

## 2012-05-23 HISTORY — DX: Migraine, unspecified, not intractable, without status migrainosus: G43.909

## 2012-05-23 LAB — URINALYSIS, ROUTINE W REFLEX MICROSCOPIC
Glucose, UA: NEGATIVE mg/dL
Ketones, ur: NEGATIVE mg/dL
Leukocytes, UA: NEGATIVE
Nitrite: NEGATIVE
Specific Gravity, Urine: 1.029 (ref 1.005–1.030)
pH: 6 (ref 5.0–8.0)

## 2012-05-23 LAB — COMPREHENSIVE METABOLIC PANEL
ALT: 11 U/L (ref 0–35)
AST: 15 U/L (ref 0–37)
Albumin: 3.7 g/dL (ref 3.5–5.2)
Calcium: 9.3 mg/dL (ref 8.4–10.5)
Creatinine, Ser: 0.77 mg/dL (ref 0.50–1.10)
GFR calc non Af Amer: >90 mL/min (ref >90)
Sodium: 136 mEq/L (ref 135–145)
Total Protein: 7.4 g/dL (ref 6.0–8.3)

## 2012-05-23 LAB — CBC WITH DIFFERENTIAL/PLATELET
Basophils Relative: 0 % (ref 0–1)
Eosinophils Absolute: 0.1 10*3/uL (ref 0.0–0.7)
HCT: 37.1 % (ref 36.0–46.0)
Hemoglobin: 12.2 g/dL (ref 12.0–15.0)
Lymphs Abs: 2.6 10*3/uL (ref 0.7–4.0)
MCH: 26.2 pg (ref 26.0–34.0)
MCHC: 32.9 g/dL (ref 30.0–36.0)
Monocytes Absolute: 0.5 10*3/uL (ref 0.1–1.0)
Monocytes Relative: 6 % (ref 3–12)
Neutrophils Relative %: 62 % (ref 43–77)
RBC: 4.66 MIL/uL (ref 3.87–5.11)

## 2012-05-23 LAB — TROPONIN I: Troponin I: <0.3 ng/mL (ref <0.30)

## 2012-05-23 LAB — LIPASE, BLOOD: Lipase: 23 U/L (ref 11–59)

## 2012-05-23 LAB — PREGNANCY, URINE: Preg Test, Ur: NEGATIVE

## 2012-05-23 MED ORDER — DIPHENHYDRAMINE HCL 50 MG/ML IJ SOLN
50.0000 mg | Freq: Once | INTRAMUSCULAR | Status: AC
  Administered 2012-05-23: 50 mg via INTRAVENOUS
  Filled 2012-05-23: qty 1

## 2012-05-23 MED ORDER — SODIUM CHLORIDE 0.9 % IV BOLUS (SEPSIS)
500.0000 mL | Freq: Once | INTRAVENOUS | Status: AC
  Administered 2012-05-23: 500 mL via INTRAVENOUS

## 2012-05-23 MED ORDER — GADOBENATE DIMEGLUMINE 529 MG/ML IV SOLN
20.0000 mL | Freq: Once | INTRAVENOUS | Status: AC | PRN
  Administered 2012-05-23: 20 mL via INTRAVENOUS

## 2012-05-23 MED ORDER — METOCLOPRAMIDE HCL 10 MG PO TABS: 10.0000 mg | ORAL_TABLET | Freq: Four times a day (QID) | ORAL | Status: DC | PRN

## 2012-05-23 MED ORDER — SODIUM CHLORIDE 0.9 % IV SOLN
INTRAVENOUS | Status: DC
  Administered 2012-05-23: 13:00:00 via INTRAVENOUS

## 2012-05-23 MED ORDER — METOCLOPRAMIDE HCL 5 MG/ML IJ SOLN
10.0000 mg | Freq: Once | INTRAMUSCULAR | Status: AC
  Administered 2012-05-23: 10 mg via INTRAVENOUS
  Filled 2012-05-23: qty 2

## 2012-05-23 MED ORDER — KETOROLAC TROMETHAMINE 30 MG/ML IJ SOLN
30.0000 mg | Freq: Once | INTRAMUSCULAR | Status: AC
  Administered 2012-05-23: 30 mg via INTRAVENOUS
  Filled 2012-05-23: qty 1

## 2012-05-23 NOTE — ED Notes (Addendum)
Pt complains of headache to the posterior head area on left side. Pt complains of nausea and vomiting x 2 days with blurred vision.   Addendum: Pt seen in ED this week with a neg head CT.  Pt here complaining of  worsening headache, tingling in left arm and face, left photophobia and blurred vision.

## 2012-05-23 NOTE — ED Provider Notes (Signed)
History     CSN: [id number]  Arrival date & time 05/23/12  1041   First MD Initiated Contact with Patient 05/23/12 1052      Chief Complaint  Patient presents with  . Blurred Vision  . Headache    HPI Pt was seen at 1050.  Per pt, c/o gradual onset and persistence of constant acute flair of her chronic migraine headache for the past 5 days. Describes the headache as per her usual chronic migraine headache pain pattern for many years.  Denies headache was sudden or maximal in onset or at any time. States the left sided headache began after she was "found on the floor unconscious" 5 days ago. Has been associated with constant left eye "blurry vision," left face and arm "numbness," as well as several episodes of N/V and vague constant chest "pains" for the past 5 days. Denies focal motor weakness, no fevers, no neck pain, no back pain, no abd pain, no diarrhea, no rash.       Past Medical History  Diagnosis Date  . Seizure   . Bipolar 1 disorder   . PTSD (post-traumatic stress disorder)   . Multiple sclerosis   . Sleep apnea   . Hypertension   . Migraine headache     Past Surgical History  Procedure Laterality Date  . Abdominal hysterectomy    . Cesarean section    . Hemorrhoid surgery    . Breast reduction surgery      History  Substance Use Topics  . Smoking status: Never Smoker   . Smokeless tobacco: Never Used  . Alcohol Use: Yes    Review of Systems ROS: Statement: All systems negative except as marked or noted in the HPI; Constitutional: Negative for fever and chills. ; ; Eyes: Negative for eye pain, redness and discharge. ; ; ENMT: Negative for ear pain, hoarseness, nasal congestion, sinus pressure and sore throat. ; ; Cardiovascular: +CP. Negative for palpitations, diaphoresis, dyspnea and peripheral edema. ; ; Respiratory: Negative for cough, wheezing and stridor. ; ; Gastrointestinal: +N/V. Negative for diarrhea, abdominal pain, blood in stool, hematemesis,  jaundice and rectal bleeding. . ; ; Genitourinary: Negative for dysuria, flank pain and hematuria. ; ; Musculoskeletal: Negative for back pain and neck pain. Negative for swelling and trauma.; ; Skin: Negative for pruritus, rash, abrasions, blisters, bruising and skin lesion.; ; Neuro: +headache, paresthesias. Negative for lightheadedness and neck stiffness. Negative for weakness, altered level of consciousness , altered mental status, extremity weakness, involuntary movement, seizure and syncope.      Allergies  Aspirin; Latex; and Penicillins  Home Medications   Current Outpatient Rx  Name  Route  Sig  Dispense  Refill  . ibuprofen (ADVIL,MOTRIN) 200 MG tablet   Oral   Take 400 mg by mouth every 8 (eight) hours as needed for pain.         Marland Kitchen levETIRAcetam (KEPPRA) 250 MG tablet   Oral   Take 500 mg by mouth 3 (three) times daily.          Marland Kitchen lithium carbonate 300 MG capsule   Oral   Take 300-600 mg by mouth 3 (three) times daily. 1 cap bid and 2 caps qhs         . metoprolol (LOPRESSOR) 100 MG tablet   Oral   Take 100 mg by mouth 2 (two) times daily.           . promethazine (PHENERGAN) 25 MG tablet   Oral  Take 12.5-25 mg by mouth every 6 (six) hours as needed for nausea.            BP 134/86  Pulse 75  Temp(Src) 98.9 F (37.2 C) (Oral)  SpO2 100%  Physical Exam 1055: Physical examination:  Nursing notes reviewed; Vital signs and O2 SAT reviewed;  Constitutional: Well developed, Well nourished, Well hydrated, In no acute distress; Head:  Normocephalic, atraumatic; Eyes: EOMI, PERRL, No scleral icterus; ENMT: Mouth and pharynx normal, Mucous membranes moist; Neck: Supple, Full range of motion, No lymphadenopathy; Cardiovascular: Regular rate and rhythm, No murmur, rub, or gallop; Respiratory: Breath sounds clear & equal bilaterally, No rales, rhonchi, wheezes.  Speaking full sentences with ease, Normal respiratory effort/excursion; Chest: Nontender, Movement  normal; Abdomen: Soft, Nontender, Nondistended, Normal bowel sounds; Genitourinary: No CVA tenderness; Extremities: Pulses normal, No tenderness, No edema, No calf edema or asymmetry.; Neuro: AA&Ox3, Major CN grossly intact.  Strength 5/5 equal bilat UE's and LE's.  DTR 2/4 equal bilat UE's and LE's.  No gross sensory deficits.  Normal cerebellar testing bilat UE's (finger-nose) and LE's (heel-shin). Speech clear.  No facial droop.  No nystagmus. Climbs on and off stretcher easily by herself. Gait steady.;; Skin: Color normal, Warm, Dry.   ED Course  Procedures    MDM  MDM Reviewed: nursing note, previous chart and vitals Reviewed previous: ECG, labs and CT scan Interpretation: ECG, labs, MRI and x-ray    Date: 05/23/2012  Rate: 65  Rhythm: normal sinus rhythm  QRS Axis: normal  Intervals: normal  ST/T Wave abnormalities: normal  Conduction Disutrbances:none  Narrative Interpretation:   Old EKG Reviewed: unchanged; no significant changes from previous EKG dated 11/06/2010.  Results for orders placed during the hospital encounter of 05/23/12  URINALYSIS, ROUTINE W REFLEX MICROSCOPIC      Result Value Range   Color, Urine YELLOW  YELLOW   APPearance CLEAR  CLEAR   Specific Gravity, Urine 1.029  1.005 - 1.030   pH 6.0  5.0 - 8.0   Glucose, UA NEGATIVE  NEGATIVE mg/dL   Hgb urine dipstick NEGATIVE  NEGATIVE   Bilirubin Urine NEGATIVE  NEGATIVE   Ketones, ur NEGATIVE  NEGATIVE mg/dL   Protein, ur NEGATIVE  NEGATIVE mg/dL   Urobilinogen, UA 0.2  0.0 - 1.0 mg/dL   Nitrite NEGATIVE  NEGATIVE   Leukocytes, UA NEGATIVE  NEGATIVE  PREGNANCY, URINE      Result Value Range   Preg Test, Ur NEGATIVE  NEGATIVE  TROPONIN I      Result Value Range   Troponin I <0.30  <0.30 ng/mL  LIPASE, BLOOD      Result Value Range   Lipase 23  11 - 59 U/L  COMPREHENSIVE METABOLIC PANEL      Result Value Range   Sodium 136  135 - 145 mEq/L   Potassium 4.1  3.5 - 5.1 mEq/L   Chloride 102  96 - 112  mEq/L   CO2 26  19 - 32 mEq/L   Glucose, Bld 84  70 - 99 mg/dL   BUN 11  6 - 23 mg/dL   Creatinine, Ser 2.13  0.50 - 1.10 mg/dL   Calcium 9.3  8.4 - 08.6 mg/dL   Total Protein 7.4  6.0 - 8.3 g/dL   Albumin 3.7  3.5 - 5.2 g/dL   AST 15  0 - 37 U/L   ALT 11  0 - 35 U/L   Alkaline Phosphatase 78  39 - 117 U/L  Total Bilirubin 0.2 (*) 0.3 - 1.2 mg/dL   GFR calc non Af Amer >90  >90 mL/min   GFR calc Af Amer >90  >90 mL/min  CBC WITH DIFFERENTIAL      Result Value Range   WBC 8.3  4.0 - 10.5 K/uL   RBC 4.66  3.87 - 5.11 MIL/uL   Hemoglobin 12.2  12.0 - 15.0 g/dL   HCT 96.0  45.4 - 09.8 %   MCV 79.6  78.0 - 100.0 fL   MCH 26.2  26.0 - 34.0 pg   MCHC 32.9  30.0 - 36.0 g/dL   RDW 11.9  14.7 - 82.9 %   Platelets 301  150 - 400 K/uL   Neutrophils Relative 62  43 - 77 %   Neutro Abs 5.1  1.7 - 7.7 K/uL   Lymphocytes Relative 31  12 - 46 %   Lymphs Abs 2.6  0.7 - 4.0 K/uL   Monocytes Relative 6  3 - 12 %   Monocytes Absolute 0.5  0.1 - 1.0 K/uL   Eosinophils Relative 1  0 - 5 %   Eosinophils Absolute 0.1  0.0 - 0.7 K/uL   Basophils Relative 0  0 - 1 %   Basophils Absolute 0.0  0.0 - 0.1 K/uL  LITHIUM LEVEL      Result Value Range   Lithium Lvl <0.25 (*) 0.80 - 1.40 mEq/L   Dg Chest 2 View 05/23/2012  *RADIOLOGY REPORT*  Clinical Data: Chest tightness.  CHEST - 2 VIEW  Comparison: None.  Findings: Normal sized heart.  Clear lungs with normal vascularity. Minimal scoliosis and minimal thoracic spine degenerative changes.  IMPRESSION: No acute abnormality.   Original Report Authenticated By: Beckie Salts, M.D.    Ct Head Wo Contrast 05/18/2012  *RADIOLOGY REPORT*  Clinical Data: petit mal seizure.  Loss of consciousness.  CT HEAD WITHOUT CONTRAST  Technique:  Contiguous axial images were obtained from the base of the skull through the vertex without contrast.  Comparison: 11/06/2010.  Findings: No mass lesion, mass effect, midline shift, hydrocephalus, hemorrhage.  No territorial ischemia  or acute infarction.  Scalp calcifications are present, chronic.  Paranasal sinuses are within normal limits.  IMPRESSION: Negative CT head.   Original Report Authenticated By: Andreas Newport, M.D.    Mr Laqueta Jean FA Contrast 05/23/2012  *RADIOLOGY REPORT*  Clinical Data: Severe headache.  Numbness and tingling.  History of multiple sclerosis.  MRI HEAD WITHOUT AND WITH CONTRAST  Technique:  Multiplanar, multiecho pulse sequences of the brain and surrounding structures were obtained according to standard protocol without and with intravenous contrast  Contrast: 20mL MULTIHANCE GADOBENATE DIMEGLUMINE 529 MG/ML IV SOLN  Comparison: CT head without contrast 05/18/2012.  Findings: A single subcortical T2 hyperintensity is present in the left frontal lobe.  There is no associated restricted diffusion or enhancement.  No other significant white matter disease is evident. A benign-appearing 9 mm cyst is present within the infundibular recess of the third ventricle.  This is just above the optic chiasm.  No acute infarct, hemorrhage, or mass lesion is present.  The midline structures are otherwise within normal limits.  Flow is present in the major intracranial arteries.  The globes and orbits are intact.  The paranasal sinuses and mastoid air cells are clear.  IMPRESSION:  1.  Cystic lesion appears to be within the infundibular recess of the third ventricle.  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 2. Single subcortical T2  hyperintensity in the anterior left frontal lobe.  This is nonspecific, but could be related to and myelination process such as multiple sclerosis.  There is no imaging evidence for acute demyelination. 3.  No acute intracranial abnormality.   Original Report Authenticated By: Marin Roberts, M.D.      [phone]:  MRI with known MS. [facility] 20[facility] bilat eyes.  Pt feels "better now" and wants to go home.  Neuro exam continues intact and unchanged. VSS.  EKG unchanged from  previous and troponin normal with 2 days of constant symptoms; doubt ACS as cause for symptoms.  Doubt PE with low risk Well's.  No red flags with headache; continue to tx symptomatically.  Encouraged to f/u with her Neuro MD.  Dx and testing d/w pt and family, including incidental finding of interventricular arachnoid cyst and small hyperintensity in left frontal lobe possible related to her already known hx of MS.  Questions answered.  Verb understanding, agreeable to d/c home with outpt f/u.             Laray Anger, DO 05/25/12 1114

## 2012-05-23 NOTE — ED Notes (Signed)
MD at bedside. 

## 2012-05-23 NOTE — ED Notes (Addendum)
Pt was seen on Monday for facial numbness and blurred vision states that she is still having the same sx and that she denies having a seizure. Alert x4,    Addendum:  Pt states that she uses a cane as needed.

## 2012-10-08 ENCOUNTER — Emergency Department (HOSPITAL_COMMUNITY): Payer: Non-veteran care

## 2012-10-08 ENCOUNTER — Encounter (HOSPITAL_COMMUNITY): Payer: Self-pay | Admitting: Emergency Medicine

## 2012-10-08 ENCOUNTER — Emergency Department (HOSPITAL_COMMUNITY)
Admission: EM | Admit: 2012-10-08 | Discharge: 2012-10-08 | Disposition: A | Discharge status: Home / Self Care | Payer: Non-veteran care | Attending: Emergency Medicine | Admitting: Emergency Medicine

## 2012-10-08 DIAGNOSIS — R42 Dizziness and giddiness: Secondary | ICD-10-CM | POA: Insufficient documentation

## 2012-10-08 DIAGNOSIS — R0789 Other chest pain: Secondary | ICD-10-CM | POA: Insufficient documentation

## 2012-10-08 DIAGNOSIS — Z880 Allergy status to penicillin: Secondary | ICD-10-CM | POA: Insufficient documentation

## 2012-10-08 DIAGNOSIS — F319 Bipolar disorder, unspecified: Secondary | ICD-10-CM | POA: Insufficient documentation

## 2012-10-08 DIAGNOSIS — Z79899 Other long term (current) drug therapy: Secondary | ICD-10-CM | POA: Insufficient documentation

## 2012-10-08 DIAGNOSIS — I10 Essential (primary) hypertension: Secondary | ICD-10-CM | POA: Insufficient documentation

## 2012-10-08 DIAGNOSIS — R269 Unspecified abnormalities of gait and mobility: Secondary | ICD-10-CM | POA: Insufficient documentation

## 2012-10-08 DIAGNOSIS — R0602 Shortness of breath: Secondary | ICD-10-CM | POA: Insufficient documentation

## 2012-10-08 DIAGNOSIS — R51 Headache: Secondary | ICD-10-CM | POA: Insufficient documentation

## 2012-10-08 DIAGNOSIS — Z8669 Personal history of other diseases of the nervous system and sense organs: Secondary | ICD-10-CM | POA: Insufficient documentation

## 2012-10-08 DIAGNOSIS — R079 Chest pain, unspecified: Secondary | ICD-10-CM

## 2012-10-08 DIAGNOSIS — F4310 Post-traumatic stress disorder, unspecified: Secondary | ICD-10-CM | POA: Insufficient documentation

## 2012-10-08 DIAGNOSIS — Z91040 Latex allergy status: Secondary | ICD-10-CM | POA: Insufficient documentation

## 2012-10-08 DIAGNOSIS — R519 Headache, unspecified: Secondary | ICD-10-CM

## 2012-10-08 DIAGNOSIS — G40909 Epilepsy, unspecified, not intractable, without status epilepticus: Secondary | ICD-10-CM | POA: Insufficient documentation

## 2012-10-08 DIAGNOSIS — G43909 Migraine, unspecified, not intractable, without status migrainosus: Secondary | ICD-10-CM | POA: Insufficient documentation

## 2012-10-08 DIAGNOSIS — Z3202 Encounter for pregnancy test, result negative: Secondary | ICD-10-CM | POA: Insufficient documentation

## 2012-10-08 LAB — CBC WITH DIFFERENTIAL/PLATELET
Eosinophils Relative: 2 % (ref 0–5)
HCT: 34.5 % — ABNORMAL LOW (ref 36.0–46.0)
Hemoglobin: 11.3 g/dL — ABNORMAL LOW (ref 12.0–15.0)
Lymphocytes Relative: 26 % (ref 12–46)
Lymphs Abs: 2.1 10*3/uL (ref 0.7–4.0)
MCH: 25.7 pg — ABNORMAL LOW (ref 26.0–34.0)
MCV: 78.6 fL (ref 78.0–100.0)
Monocytes Relative: 6 % (ref 3–12)
Platelets: 302 10*3/uL (ref 150–400)
RBC: 4.39 MIL/uL (ref 3.87–5.11)
WBC: 8.3 10*3/uL (ref 4.0–10.5)

## 2012-10-08 LAB — URINALYSIS, ROUTINE W REFLEX MICROSCOPIC
Glucose, UA: NEGATIVE mg/dL
Hgb urine dipstick: NEGATIVE
Leukocytes, UA: NEGATIVE
pH: 5 (ref 5.0–8.0)

## 2012-10-08 LAB — PREGNANCY, URINE: Preg Test, Ur: NEGATIVE

## 2012-10-08 LAB — BASIC METABOLIC PANEL
BUN: 8 mg/dL (ref 6–23)
CO2: 24 mEq/L (ref 19–32)
Calcium: 9.2 mg/dL (ref 8.4–10.5)
Glucose, Bld: 93 mg/dL (ref 70–99)
Potassium: 4 mEq/L (ref 3.5–5.1)
Sodium: 137 mEq/L (ref 135–145)

## 2012-10-08 MED ORDER — ONDANSETRON 8 MG PO TBDP: 8.0000 mg | ORAL_TABLET | Freq: Three times a day (TID) | ORAL | Status: DC | PRN

## 2012-10-08 MED ORDER — METOCLOPRAMIDE HCL 5 MG/ML IJ SOLN
10.0000 mg | Freq: Once | INTRAMUSCULAR | Status: AC
  Administered 2012-10-08: 10 mg via INTRAVENOUS
  Filled 2012-10-08: qty 2

## 2012-10-08 MED ORDER — KETOROLAC TROMETHAMINE 30 MG/ML IJ SOLN
30.0000 mg | Freq: Once | INTRAMUSCULAR | Status: AC
Start: 2012-10-08 — End: 2012-10-08
  Administered 2012-10-08: 30 mg via INTRAVENOUS
  Filled 2012-10-08: qty 1

## 2012-10-08 MED ORDER — SODIUM CHLORIDE 0.9 % IV BOLUS (SEPSIS)
1000.0000 mL | Freq: Once | INTRAVENOUS | Status: AC
  Administered 2012-10-08: 1000 mL via INTRAVENOUS

## 2012-10-08 MED ORDER — MECLIZINE HCL 50 MG PO TABS: 50.0000 mg | ORAL_TABLET | Freq: Three times a day (TID) | ORAL | Status: DC | PRN

## 2012-10-08 MED ORDER — IBUPROFEN 600 MG PO TABS: 600.0000 mg | ORAL_TABLET | Freq: Four times a day (QID) | ORAL | Status: DC | PRN

## 2012-10-08 MED ORDER — DIPHENHYDRAMINE HCL 50 MG/ML IJ SOLN
25.0000 mg | Freq: Once | INTRAMUSCULAR | Status: AC
  Administered 2012-10-08: 25 mg via INTRAVENOUS
  Filled 2012-10-08: qty 2

## 2012-10-08 NOTE — ED Notes (Signed)
Patient c/o having slight nausea when returning from bathroom.

## 2012-10-08 NOTE — ED Notes (Signed)
Ambulated in the hallway with slight assistance x 1 person. Patient denied any dizziness.

## 2012-10-08 NOTE — ED Notes (Signed)
C/O chest tightness, awakened from sleep 7/10, states 5/10 now. Also complains of weakness and feeling short of breath

## 2012-10-08 NOTE — ED Provider Notes (Signed)
CSN: [id number]     Arrival date & time 10/08/12  1306 History     First MD Initiated Contact with Patient 10/08/12 1335     Chief Complaint  Patient presents with  . unsteady gait, chest tightness    (Consider location/radiation/quality/duration/timing/severity/associated sxs/prior Treatment) HPI Comments: Pt comes in with cc of chest tightness. Pt has hx of seizures, HTN and is on Lithium. Pt states that she had a mid sternal chest tightness this morning that woke her up from the sleep. The pain is intermittent, lasting about 2 minutes. Pain has no specific aggravating or relieving factor, it is non radiating, no associated nausea, diaphoresis, although, she does have some subjective dyspnea. No hx of PE, DVT and no risk factors for the same. Pt has no hx of CAD, no family hx of premature CAD, and no smoking or illicit drug use. Pt also has been having some headaches. She has hx of similar headache, but not as severe. The headaches are pressure like, and located on the vertex. She admits to some gait instability x 1 episode, improved with laying supine. She has no hx of strokes, no fall, no syncope.   The history is provided by the patient and medical records.    Past Medical History  Diagnosis Date  . Seizure   . Bipolar 1 disorder   . PTSD (post-traumatic stress disorder)   . Multiple sclerosis   . Sleep apnea   . Hypertension   . Migraine headache    Past Surgical History  Procedure Laterality Date  . Abdominal hysterectomy    . Cesarean section    . Hemorrhoid surgery    . Breast reduction surgery     Family History  Problem Relation Age of Onset  . Diabetes Mother   . Hypertension Mother   . Diabetes Father   . Hypertension Father    History  Substance Use Topics  . Smoking status: Never Smoker   . Smokeless tobacco: Never Used  . Alcohol Use: Yes     Comment: occassional wine   OB History   Grav Para Term Preterm Abortions TAB SAB Ect Mult Living   3     Review of Systems  Constitutional: Negative for activity change.  HENT: Negative for facial swelling and neck pain.   Respiratory: Positive for chest tightness and shortness of breath. Negative for cough and wheezing.   Cardiovascular: Positive for chest pain. Negative for palpitations and leg swelling.  Gastrointestinal: Negative for vomiting, abdominal pain, diarrhea, constipation, blood in stool and abdominal distention.  Genitourinary: Negative for hematuria and difficulty urinating.  Skin: Negative for color change.  Neurological: Positive for dizziness. Negative for syncope and speech difficulty.  Hematological: Does not bruise/bleed easily.  Psychiatric/Behavioral: Negative for confusion.    Allergies  Aspirin; Penicillins; and Latex  Home Medications   Current Outpatient Rx  Name  Route  Sig  Dispense  Refill  . ARIPiprazole (ABILIFY PO)   Oral   Take 1 tablet by mouth daily.         . Cholecalciferol (VITAMIN D PO)   Oral   Take 1 tablet by mouth daily.         Marland Kitchen ibuprofen (ADVIL,MOTRIN) 200 MG tablet   Oral   Take 400 mg by mouth every 8 (eight) hours as needed for pain.         Marland Kitchen levETIRAcetam (KEPPRA) 250 MG tablet   Oral   Take 1,500-2,000 mg by mouth 2 (two) times  daily.          . lithium carbonate 300 MG capsule   Oral   Take 600 mg by mouth 3 (three) times daily with meals.          . metoCLOPramide (REGLAN) 10 MG tablet   Oral   Take 1 tablet (10 mg total) by mouth every 6 (six) hours as needed (for headache or nausea).   6 tablet   0   . metoprolol (LOPRESSOR) 100 MG tablet   Oral   Take 100 mg by mouth 2 (two) times daily.           . promethazine (PHENERGAN) 25 MG tablet   Oral   Take 12.5-25 mg by mouth every 6 (six) hours as needed for nausea.           BP 132/75  Pulse 72  Temp(Src) 98.5 F (36.9 C) (Oral)  Resp 17  SpO2 100% Physical Exam  Nursing note and vitals reviewed. Constitutional: She is oriented to  person, place, and time. She appears well-developed.  HENT:  Head: Normocephalic and atraumatic.  Eyes: Conjunctivae and EOM are normal. Pupils are equal, round, and reactive to light.  No nystagmus  Neck: Normal range of motion. Neck supple.  Cardiovascular: Normal rate, regular rhythm and normal heart sounds.   No murmur heard. Pulmonary/Chest: Effort normal and breath sounds normal. No respiratory distress.  Abdominal: Soft. Bowel sounds are normal. She exhibits no distension. There is no tenderness. There is no rebound and no guarding.  Neurological: She is alert and oriented to person, place, and time. No cranial nerve deficit. Coordination normal.  Cerebellar exam is normal (finger to nose) Sensory exam normal for bilateral upper and lower extremities - and patient is able to discriminate between sharp and dull. Motor exam is 4+/5   Skin: Skin is warm and dry.    ED Course   Procedures (including critical care time)  Labs Reviewed  CBC WITH DIFFERENTIAL  BASIC METABOLIC PANEL  TROPONIN I  URINALYSIS, ROUTINE W REFLEX MICROSCOPIC  PREGNANCY, URINE  LITHIUM LEVEL   No results found. No diagnosis found.  MDM   Date: 10/08/2012  Rate: 66  Rhythm: normal sinus rhythm  QRS Axis: normal  Intervals: normal  ST/T Wave abnormalities: normal  Conduction Disutrbances: none  Narrative Interpretation: unremarkable  Differential diagnosis includes: ACS syndrome Pneumonia Dissection PE Musculoskeletal pain Orthostatic hypotension Stroke Vertebral artery dissection/stenosis Dysrhythmia Medication side effects  Pt comes in with cc of chest tightness, headaches, vertigo x 1. She is hemodynamically stable. Bilateral pulses are equal, pain is intermittent in the chest and her cardiac ekg and exam are normal. CXR pending. Dissection concern really low, especially since her BP is well controlled, she is a young patient with no substance abuse, uncontrolled BP. Will get trops  x 2 for ACS r/u. Bilateral BP checked.  Pt has hx of headaches. Current headache similar, just worse. Neuro exam is WNL. Vertigo x 1 time only. Will give headache cocktail. No indication for CT.      Derwood Kaplan, MD 10/08/12 [phone]

## 2013-04-28 ENCOUNTER — Encounter: Payer: Self-pay | Admitting: Family Medicine

## 2013-04-28 ENCOUNTER — Ambulatory Visit (INDEPENDENT_AMBULATORY_CARE_PROVIDER_SITE_OTHER): Payer: BC Managed Care – HMO | Admitting: Family Medicine

## 2013-04-28 VITALS — BP 135/87 | HR 96 | Temp 98.6°F | Ht 67.0 in | Wt 233.1 lb

## 2013-04-28 DIAGNOSIS — R569 Unspecified convulsions: Secondary | ICD-10-CM | POA: Insufficient documentation

## 2013-04-28 DIAGNOSIS — F3132 Bipolar disorder, current episode depressed, moderate: Secondary | ICD-10-CM

## 2013-04-28 MED ORDER — LEVETIRACETAM 250 MG PO TABS: 1000.0000 mg | ORAL_TABLET | Freq: Two times a day (BID) | ORAL | Status: DC

## 2013-04-28 NOTE — Patient Instructions (Signed)
I want you to increase your Keppra to 4 tabs (1,000 mg) twice a day which is an increase of 500 mg from your old dose. [facility]  They are in a better position to say if additional testing is needed.

## 2013-04-29 NOTE — Assessment & Plan Note (Signed)
Recently worse.  [facility]  No med adjustments.  May need that or increased counseling.

## 2013-04-29 NOTE — Assessment & Plan Note (Signed)
Longstanding seizure disorder which had been well controled.  Seizure versus pseuodo seizure (see bipolar.)  Again, [facility] and [facility] work [facility].  I will increase her Kai LevinsKeprra (on very low dose now.)  Emphasized importance of [facility] follow up - keep appointment scheduled two days from now.

## 2013-04-29 NOTE — Progress Notes (Signed)
   Subjective:    Patient ID: Tina Wright, female    DOB: [birth year], 43 y.o.   MRN: [id number]  HPI Patient has known seizure disorder, well controled, [facility].  No seizure x 1 year.  Has been feeling well.  Last night had seizure.  No injury.  Comes in for evaluation.  [facility]  Now feels back to normal.    Patient also has some significant emotional issues which she feels may be contributing.  ([facility] Recently, bipolar worse perhaps due to a significant stress (daughter now incarcerated.)  No SI or HI     Review of Systems     Objective:   Physical Exam Normal neuro examine Good eye contact and only mildly depressed affect.        Assessment & Plan:

## 2013-05-28 ENCOUNTER — Encounter (HOSPITAL_COMMUNITY): Payer: Self-pay | Admitting: Emergency Medicine

## 2013-05-28 ENCOUNTER — Emergency Department (HOSPITAL_COMMUNITY)
Admission: EM | Admit: 2013-05-28 | Discharge: 2013-05-28 | Disposition: A | Discharge status: Home / Self Care | Payer: BC Managed Care – HMO | Attending: Emergency Medicine | Admitting: Emergency Medicine

## 2013-05-28 ENCOUNTER — Emergency Department (HOSPITAL_COMMUNITY): Payer: BC Managed Care – HMO

## 2013-05-28 DIAGNOSIS — F4310 Post-traumatic stress disorder, unspecified: Secondary | ICD-10-CM | POA: Insufficient documentation

## 2013-05-28 DIAGNOSIS — Z880 Allergy status to penicillin: Secondary | ICD-10-CM | POA: Insufficient documentation

## 2013-05-28 DIAGNOSIS — G43909 Migraine, unspecified, not intractable, without status migrainosus: Secondary | ICD-10-CM | POA: Insufficient documentation

## 2013-05-28 DIAGNOSIS — R609 Edema, unspecified: Secondary | ICD-10-CM | POA: Insufficient documentation

## 2013-05-28 DIAGNOSIS — Z79899 Other long term (current) drug therapy: Secondary | ICD-10-CM | POA: Insufficient documentation

## 2013-05-28 DIAGNOSIS — F319 Bipolar disorder, unspecified: Secondary | ICD-10-CM | POA: Insufficient documentation

## 2013-05-28 DIAGNOSIS — R209 Unspecified disturbances of skin sensation: Secondary | ICD-10-CM | POA: Insufficient documentation

## 2013-05-28 DIAGNOSIS — R200 Anesthesia of skin: Secondary | ICD-10-CM

## 2013-05-28 DIAGNOSIS — G40909 Epilepsy, unspecified, not intractable, without status epilepticus: Secondary | ICD-10-CM | POA: Insufficient documentation

## 2013-05-28 DIAGNOSIS — I10 Essential (primary) hypertension: Secondary | ICD-10-CM | POA: Insufficient documentation

## 2013-05-28 DIAGNOSIS — Z91040 Latex allergy status: Secondary | ICD-10-CM | POA: Insufficient documentation

## 2013-05-28 LAB — CBC WITH DIFFERENTIAL/PLATELET
Basophils Absolute: 0 10*3/uL (ref 0.0–0.1)
Basophils Relative: 0 % (ref 0–1)
Eosinophils Absolute: 0.1 10*3/uL (ref 0.0–0.7)
Eosinophils Relative: 1 % (ref 0–5)
HCT: 34.3 % — ABNORMAL LOW (ref 36.0–46.0)
Hemoglobin: 10.9 g/dL — ABNORMAL LOW (ref 12.0–15.0)
Lymphocytes Relative: 31 % (ref 12–46)
Lymphs Abs: 2.2 10*3/uL (ref 0.7–4.0)
MCH: 24.9 pg — ABNORMAL LOW (ref 26.0–34.0)
MCHC: 31.8 g/dL (ref 30.0–36.0)
MCV: 78.5 fL (ref 78.0–100.0)
Monocytes Absolute: 0.4 10*3/uL (ref 0.1–1.0)
Monocytes Relative: 6 % (ref 3–12)
Neutro Abs: 4.4 10*3/uL (ref 1.7–7.7)
Neutrophils Relative %: 62 % (ref 43–77)
Platelets: 334 10*3/uL (ref 150–400)
RBC: 4.37 MIL/uL (ref 3.87–5.11)
RDW: 14.5 % (ref 11.5–15.5)
WBC: 7.2 10*3/uL (ref 4.0–10.5)

## 2013-05-28 LAB — BASIC METABOLIC PANEL
BUN: 8 mg/dL (ref 6–23)
CO2: 23 mEq/L (ref 19–32)
Calcium: 9.1 mg/dL (ref 8.4–10.5)
Chloride: 104 mEq/L (ref 96–112)
Creatinine, Ser: 0.68 mg/dL (ref 0.50–1.10)
GFR calc Af Amer: >90 mL/min (ref >90)
GFR calc non Af Amer: >90 mL/min (ref >90)
Glucose, Bld: 94 mg/dL (ref 70–99)
Potassium: 4.2 mEq/L (ref 3.7–5.3)
Sodium: 139 mEq/L (ref 137–147)

## 2013-05-28 MED ORDER — GADOBENATE DIMEGLUMINE 529 MG/ML IV SOLN
20.0000 mL | Freq: Once | INTRAVENOUS | Status: AC | PRN
  Administered 2013-05-28: 20 mL via INTRAVENOUS

## 2013-05-28 MED ORDER — SODIUM CHLORIDE 0.9 % IV BOLUS (SEPSIS)
1000.0000 mL | Freq: Once | INTRAVENOUS | Status: AC
  Administered 2013-05-28: 1000 mL via INTRAVENOUS

## 2013-05-28 MED ORDER — DIAZEPAM 5 MG/ML IJ SOLN
2.5000 mg | Freq: Once | INTRAMUSCULAR | Status: AC
  Administered 2013-05-28: 2.5 mg via INTRAVENOUS
  Filled 2013-05-28: qty 2

## 2013-05-28 NOTE — Discharge Instructions (Signed)
Paresthesia °Paresthesia is an abnormal burning or prickling sensation. This sensation is generally felt in the hands, arms, legs, or feet. However, it may occur in any part of the body. It is usually not painful. The feeling may be described as: °· Tingling or numbness. °· "Pins and needles." °· Skin crawling. °· Buzzing. °· Limbs "falling asleep." °· Itching. °Most people experience temporary (transient) paresthesia at some time in their lives. °CAUSES  °Paresthesia may occur when you breathe too quickly (hyperventilation). It can also occur without any apparent cause. Commonly, paresthesia occurs when pressure is placed on a nerve. The feeling quickly goes away once the pressure is removed. For some people, however, paresthesia is a long-lasting (chronic) condition caused by an underlying disorder. The underlying disorder may be: °· A traumatic, direct injury to nerves. Examples include a: °· Broken (fractured) neck. °· Fractured skull. °· A disorder affecting the brain and spinal cord (central nervous system). Examples include: °· Transverse myelitis. °· Encephalitis. °· Transient ischemic attack. °· Multiple sclerosis. °· Stroke. °· Tumor or blood vessel problems, such as an arteriovenous malformation pressing against the brain or spinal cord. °· A condition that damages the peripheral nerves (peripheral neuropathy). Peripheral nerves are not part of the brain and spinal cord. These conditions include: °· Diabetes. °· Peripheral vascular disease. °· Nerve entrapment syndromes, such as carpal tunnel syndrome. °· Shingles. °· Hypothyroidism. °· Vitamin B12 deficiencies. °· Alcoholism. °· Heavy metal poisoning (lead, arsenic). °· Rheumatoid arthritis. °· Systemic lupus erythematosus. °DIAGNOSIS  °Your caregiver will attempt to find the underlying cause of your paresthesia. Your caregiver may: °· Take your medical history. °· Perform a physical exam. °· Order various lab tests. °· Order imaging tests. °TREATMENT    °Treatment for paresthesia depends on the underlying cause. °HOME CARE INSTRUCTIONS °· Avoid drinking alcohol. °· You may consider massage or acupuncture to help relieve your symptoms. °· Keep all follow-up appointments as directed by your caregiver. °SEEK IMMEDIATE MEDICAL CARE IF:  °· You feel weak. °· You have trouble walking or moving. °· You have problems with speech or vision. °· You feel confused. °· You cannot control your bladder or bowel movements. °· You feel numbness after an injury. °· You faint. °· Your burning or prickling feeling gets worse when walking. °· You have pain, cramps, or dizziness. °· You develop a rash. °MAKE SURE YOU: °· Understand these instructions. °· Will watch your condition. °· Will get help right away if you are not doing well or get worse. °Document Released: 02/01/2002 Document Revised: 05/06/2011 Document Reviewed: 11/02/2010 °ExitCare® Patient Information ©2014 [employer]. ° °

## 2013-05-28 NOTE — ED Notes (Addendum)
Pt c/o generalized extremity swelling, numbness and tingling starting this morning.  Sts "my legs almost gave away earlier."  Pain score 6/10.  Pt reports this has happened several times before and she was asked if she had ever been tested for MS.  Sts no definitive diagnoses has been made.

## 2013-05-28 NOTE — ED Notes (Signed)
Bed: WA18 Expected date:  Expected time:  Means of arrival:  Comments: 

## 2013-06-04 NOTE — ED Provider Notes (Signed)
CSN: [id number]     Arrival date & time 05/28/13  1047 History   First MD Initiated Contact with Patient 05/28/13 1116     Chief Complaint  Patient presents with  . Generalized Swelling   . Numbness     (Consider location/radiation/quality/duration/timing/severity/associated sxs/prior Treatment) HPI  43yF with LE weakness. Has been intermittent for past 2 months. She reports that sometimes her lower extremities this seemed very weak and she is wobbly on her feet. She has fallen before because of this. Tingling sensation from her feet up to her thighs. Has not noticed anything which seems to precipitate these symptoms. No urinary complaints. No fever or chills. Denies trauma. No new back pain. No abdominal pain. Reports someone had previously mentioned MS to her, but doesn't officially carry this diagnosis. Neurologist through [place] [facility].  Past Medical History  Diagnosis Date  . Seizure   . Bipolar 1 disorder   . PTSD (post-traumatic stress disorder)   . Multiple sclerosis   . Sleep apnea   . Hypertension   . Migraine headache    Past Surgical History  Procedure Laterality Date  . Abdominal hysterectomy    . Cesarean section    . Hemorrhoid surgery    . Breast reduction surgery     Family History  Problem Relation Age of Onset  . Diabetes Mother   . Hypertension Mother   . Diabetes Father   . Hypertension Father    History  Substance Use Topics  . Smoking status: Never Smoker   . Smokeless tobacco: Never Used  . Alcohol Use: Yes     Comment: occassional wine   OB History   Grav Para Term Preterm Abortions TAB SAB Ect Mult Living            3     Review of Systems  All systems reviewed and negative, other than as noted in HPI.   Allergies  Aspirin; Penicillins; Latex; and Strawberry  Home Medications   Current Outpatient Rx  Name  Route  Sig  Dispense  Refill  . ARIPiprazole (ABILIFY) 5 MG tablet   Oral   Take 5 mg by mouth at bedtime.         .  Cholecalciferol (VITAMIN D PO)   Oral   Take 1 tablet by mouth at bedtime.          . hydrocortisone cream 1 %   Topical   Apply 1 application topically 2 (two) times daily as needed for itching.         Marland Kitchen ibuprofen (ADVIL,MOTRIN) 200 MG tablet   Oral   Take 400 mg by mouth every 8 (eight) hours as needed for pain.         Marland Kitchen levETIRAcetam (KEPPRA) 250 MG tablet   Oral   Take 500 mg by mouth 3 (three) times daily.         Marland Kitchen lithium carbonate 300 MG capsule   Oral   Take 600 mg by mouth 3 (three) times daily with meals.          . metoCLOPramide (REGLAN) 10 MG tablet   Oral   Take 1 tablet (10 mg total) by mouth every 6 (six) hours as needed (for headache or nausea).   6 tablet   0   . metoprolol (LOPRESSOR) 100 MG tablet   Oral   Take 100 mg by mouth 2 (two) times daily.            BP 145/92  Pulse 72  Temp(Src) 98.3 F (36.8 C) (Oral)  Resp 16  SpO2 99% Physical Exam  Nursing note and vitals reviewed. Constitutional: She appears well-developed and well-nourished. No distress.  HENT:  Head: Normocephalic and atraumatic.  Eyes: Conjunctivae are normal. Right eye exhibits no discharge. Left eye exhibits no discharge.  Neck: Neck supple.  Cardiovascular: Normal rate, regular rhythm and normal heart sounds.  Exam reveals no gallop and no friction rub.   No murmur heard. Pulmonary/Chest: Effort normal and breath sounds normal. No respiratory distress.  Abdominal: Soft. She exhibits no distension. There is no tenderness.  Musculoskeletal: She exhibits no edema and no tenderness.  Neurological: She is alert. Coordination normal.  Strength is 5/5 b/l LE. Sensation intact to light touch. Patellar reflexes 1+. Muscle tone seems normal. No inducible ankle clonus. Good heel-to-shin b/l. Feet warm and good cap refill in toes.   Skin: Skin is warm and dry.  Psychiatric: She has a normal mood and affect. Her behavior is normal. Thought content normal.    ED Course   Procedures (including critical care time) Labs Review Labs Reviewed  CBC WITH DIFFERENTIAL - Abnormal; Notable for the following:    Hemoglobin 10.9 (*)    HCT 34.3 (*)    MCH 24.9 (*)    All other components within normal limits  BASIC METABOLIC PANEL   Imaging Review No results found.  Mr Lumbar Spine W Wo Contrast  05/28/2013   CLINICAL DATA:  Low back pain.  Incontinence.  EXAM: MRI LUMBAR SPINE WITHOUT AND WITH CONTRAST  TECHNIQUE: Multiplanar and multiecho pulse sequences of the lumbar spine were obtained without and with intravenous contrast.  CONTRAST:  20mL MULTIHANCE GADOBENATE DIMEGLUMINE 529 MG/ML IV SOLN  COMPARISON:  None.  FINDINGS: Vertebral body height, signal and alignment are normal. The conus medullaris is normal in signal and position. No pathologic enhancement after contrast administration is identified. Imaged intra-abdominal contents are unremarkable.  The T10-11 to L3-4 levels are negative.  L4-5: The disc is desiccated with a tiny bulge. The central spinal canal and neural foramina are widely patent.  L5-S1: A very shallow central protrusion is identified. The central canal and foramina are widely patent.  IMPRESSION: No finding to explain the patient's symptoms. Mild degenerative disc disease L4-5 and L5-S1 without central canal or foraminal stenosis. No nerve root encroachment.   Electronically Signed   By: Drusilla Kannerhomas  Dalessio M.D.   On: 05/28/2013 13:40    EKG Interpretation None      MDM   Final diagnoses:  Leg numbness  Swelling    43yF with LE numbness, weakness. Nothing objective on my exam. No explanatory pathology on MR. Ambulating w/o apparent difficulty in ED. Outpt FU with neurology.     Raeford RazorStephen Lavella Myren, MD 06/04/13 [phone]

## 2013-06-14 ENCOUNTER — Telehealth: Payer: Self-pay | Admitting: Family Medicine

## 2013-06-14 ENCOUNTER — Ambulatory Visit (INDEPENDENT_AMBULATORY_CARE_PROVIDER_SITE_OTHER): Payer: BC Managed Care – HMO | Admitting: Family Medicine

## 2013-06-14 ENCOUNTER — Encounter: Payer: Self-pay | Admitting: Family Medicine

## 2013-06-14 VITALS — BP 128/84 | HR 62 | Temp 98.6°F | Ht 67.0 in | Wt 230.6 lb

## 2013-06-14 DIAGNOSIS — R569 Unspecified convulsions: Secondary | ICD-10-CM

## 2013-06-14 DIAGNOSIS — R350 Frequency of micturition: Secondary | ICD-10-CM

## 2013-06-14 LAB — CBC WITH DIFFERENTIAL/PLATELET
BASOS ABS: 0 10*3/uL (ref 0.0–0.1)
BASOS PCT: 0 % (ref 0–1)
EOS PCT: 1 % (ref 0–5)
Eosinophils Absolute: 0.1 10*3/uL (ref 0.0–0.7)
HEMATOCRIT: 37.1 % (ref 36.0–46.0)
Hemoglobin: 12.3 g/dL (ref 12.0–15.0)
LYMPHS PCT: 30 % (ref 12–46)
Lymphs Abs: 2.6 10*3/uL (ref 0.7–4.0)
MCH: 25.7 pg — ABNORMAL LOW (ref 26.0–34.0)
MCHC: 33.2 g/dL (ref 30.0–36.0)
MCV: 77.6 fL — AB (ref 78.0–100.0)
Monocytes Absolute: 0.4 10*3/uL (ref 0.1–1.0)
Monocytes Relative: 5 % (ref 3–12)
Neutro Abs: 5.6 10*3/uL (ref 1.7–7.7)
Neutrophils Relative %: 64 % (ref 43–77)
Platelets: 343 10*3/uL (ref 150–400)
RBC: 4.78 MIL/uL (ref 3.87–5.11)
RDW: 15.3 % (ref 11.5–15.5)
WBC: 8.8 10*3/uL (ref 4.0–10.5)

## 2013-06-14 LAB — POCT UA - MICROSCOPIC ONLY

## 2013-06-14 LAB — POCT URINALYSIS DIPSTICK
Bilirubin, UA: NEGATIVE
Blood, UA: NEGATIVE
Glucose, UA: NEGATIVE
Ketones, UA: NEGATIVE
NITRITE UA: NEGATIVE
PH UA: 6.5
PROTEIN UA: NEGATIVE
Spec Grav, UA: 1.025
Urobilinogen, UA: 0.2

## 2013-06-14 MED ORDER — CIPROFLOXACIN HCL 250 MG PO TABS: 250.0000 mg | ORAL_TABLET | Freq: Two times a day (BID) | ORAL | Status: DC

## 2013-06-14 NOTE — Assessment & Plan Note (Signed)
Reported seizure episode this am.  No focal findings on exam and no reported head trauma, so will hold on neuro imaging.  - check for UTI given patient's reported increased urinary frequency, in case this could have lowered the seizure threshold.  - check BMP for electrolytes, CBC for anemia, TSH in setting of lithium use and CK since patient reports some myalgias. - will hold on increasing keppra dose. Re[facility] in [facility] next [facility] and also make a follow up appointment with her PCP.

## 2013-06-14 NOTE — Addendum Note (Signed)
Addended by: Herminio HeadsHOLDER, Tiare Rohlman on: 06/14/2013 02:42 PM   Modules accepted: Orders

## 2013-06-14 NOTE — Progress Notes (Signed)
Patient ID: Tina Wright    DOB: October 20, 1970, 43 y.o.   MRN: [id number] --- Subjective:  Tina Wright is a 43 y.o.female with h/o seizures, bipolar disorder, PTSD who presents with reported seizure this am.  She states that she woke up early this morning on the floor next to her bed. This was around 3am. She reports urinary incontinence. No stool incontinence. No tongue biting.  She states that now she has a mild headache and body aches. No one witnessed the episode.  She denies any recent fevers. She has had some increased urinary frequency. No dysuria. No abdominal pain, no diarrhea. No cough. She has been under more stress recently and states that she feels more depressed, but denies any SI/HI.  She has been taking keppra 500mg  tid and lithium 600mg  tid. She reports compliance with medications.  She [facility]. Last time her dose of keppra was adjusted was last month.  She had another seizure in March. Prior to that she had not had a seizure for a year. Seizures started in 2010.   ROS: see HPI Past Medical History: reviewed and updated medications and allergies. Social History: Tobacco: none  Objective: Filed Vitals:   06/14/13 1119  BP: 128/84  Pulse: 62  Temp: 98.6 F (37 C)    Physical Examination:   General appearance - alert, well appearing, and in no distress Ears - bilateral TM's and external ear canals normal Nose - normal and patent, no erythema, discharge or polyps Mouth - mucous membranes moist, pharynx normal without lesions Neck - supple, no significant adenopathy Chest - clear to auscultation, no wheezes, rales or rhonchi, symmetric air entry Heart - normal rate, regular rhythm, normal S1, S2, no murmurs Abdomen - soft, nontender, nondistended Neuro - CN2-12 grossly intact, normal strength in upper and lower extremities bilaterally   MRI HEAD WITHOUT AND WITH CONTRAST 05/23/13 Technique: Multiplanar, multiecho pulse sequences of  the brain and  surrounding structures were obtained according to standard protocol  without and with intravenous contrast  Contrast: 20mL MULTIHANCE GADOBENATE DIMEGLUMINE 529 MG/ML IV SOLN  Comparison: CT head without contrast 05/18/2012.  Findings: A single subcortical T2 hyperintensity is present in the  left frontal lobe. There is no associated restricted diffusion or  enhancement. No other significant white matter disease is evident.  A benign-appearing 9 mm cyst is present within the infundibular  recess of the third ventricle. This is just above the optic  chiasm.  No acute infarct, hemorrhage, or mass lesion is present. The  midline structures are otherwise within normal limits.  Flow is present in the major intracranial arteries. The globes and  orbits are intact. The paranasal sinuses and mastoid air cells are  clear.  IMPRESSION:  1. Cystic lesion appears to be within the infundibular recess of  the third ventricle. 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  2. Single subcortical T2 hyperintensity in the anterior left  frontal lobe. This is nonspecific, but could be related to and  myelination process such as multiple sclerosis. There is no  imaging evidence for acute demyelination.  3. No acute intracranial abnormality.

## 2013-06-14 NOTE — Patient Instructions (Signed)
We're going to hold on increasing your medicine for now.  We are going to test for any underlying reason that may have triggered your seizure.  We will be in touch with you with the results.   Please follow up with the neurologist and with your primary care doctor, Dr. Aviva SignsPiloto.

## 2013-06-14 NOTE — Telephone Encounter (Signed)
Called patient to let her know that her urine looked suspicious for a UTI. Will empirically treat with cipro x3 days.  If increased frequency not better after that, patient to return to care.  Also, let patient know that we need to get blood work which she did not get when she was here in the clinic.  She agreed and is going to be coming today to get it done.   Marena ChancyStephanie Tykiera Raven, PGY-3 Family Medicine Resident

## 2013-06-14 NOTE — Telephone Encounter (Signed)
Pt called and would like Dr. Gwenlyn SaranLosq to call her concerning the letter she wrote today for her work. jw

## 2013-06-14 NOTE — Telephone Encounter (Signed)
Pt states her employer said the note written today needs to say until 06/19/2013, or she will lose her job.  Note re written and faxed, per patient request to [employer] at [phone].  Original placed up front for patient pick up.  Radene OuKristen L Jekhi Bolin, CMA

## 2013-06-14 NOTE — Addendum Note (Signed)
Addended by: SwazilandJORDAN, Marieann Zipp on: 06/14/2013 02:05 PM   Modules accepted: Orders

## 2013-06-15 ENCOUNTER — Encounter: Payer: Self-pay | Admitting: Family Medicine

## 2013-06-15 ENCOUNTER — Telehealth: Payer: Self-pay | Admitting: Family Medicine

## 2013-06-15 LAB — BASIC METABOLIC PANEL
BUN: 11 mg/dL (ref 6–23)
CO2: 26 mEq/L (ref 19–32)
Calcium: 9.3 mg/dL (ref 8.4–10.5)
Chloride: 104 mEq/L (ref 96–112)
Creat: 0.73 mg/dL (ref 0.50–1.10)
GLUCOSE: 95 mg/dL (ref 70–99)
POTASSIUM: 4 meq/L (ref 3.5–5.3)
Sodium: 139 mEq/L (ref 135–145)

## 2013-06-15 LAB — TSH: TSH: 0.977 u[IU]/mL (ref 0.350–4.500)

## 2013-06-15 LAB — CK: CK TOTAL: 75 U/L (ref 7–177)

## 2013-06-15 NOTE — Telephone Encounter (Signed)
Please let patient know that her labwork did not show any abnormality that could explain her recent seizure. Thyroid function was normal. Electrolytes and kidney function was normal.   Thank you!  Marena ChancyStephanie Makye Radle, PGY-3 Family Medicine Resident

## 2013-06-15 NOTE — Telephone Encounter (Signed)
Pt notified of lab results.  Radene OuKristen L Faraz Ponciano, CMA

## 2013-07-09 ENCOUNTER — Ambulatory Visit (INDEPENDENT_AMBULATORY_CARE_PROVIDER_SITE_OTHER): Payer: BC Managed Care – HMO | Admitting: Family Medicine

## 2013-07-09 VITALS — BP 132/81 | HR 77 | Temp 99.4°F | Ht 67.0 in | Wt 226.0 lb

## 2013-07-09 DIAGNOSIS — R569 Unspecified convulsions: Secondary | ICD-10-CM

## 2013-07-09 NOTE — Patient Instructions (Signed)
Tina Wright, it was a pleasure seeing you today. Today we talked about your seizures. I am getting a keppra level on you. In the mean time, if you have any more seizures, please go to the Emergency Department to be evaluated. Since you are having seizures, I am recommending that you do not drive and have someone to drive for you.   Please make an appointment to see your PCP, Dr. Aviva SignsPiloto, in two weeks for follow-up.  If you have any questions or concerns, please do not hesitate to call the office at [phone].  Sincerely,  Jacquelin Hawkingalph Nettey, MD

## 2013-07-10 NOTE — Progress Notes (Signed)
   Subjective:    Patient ID: Tina Wright, female    DOB: [birth year], 43 y.o.   MRN: [id number]  HPI  Patient presents today with history of recent seizure. Patient had one seizure yesterday while at work consisting of generalized movements and urinary incontinence. The episode lasted a couple of minutes and was witnessed by coworkers. Patient had another episode this morning while at home. She fell down and did not hit her head. This was an unwitnessed episode. She had bowel incontinence as well. Her Keppra was recently increased to 1000mg  BID from 500mg  TID. Patient has an appointment with her neurologist on 5/25.   Review of Systems  Constitutional: Negative for fever.  Musculoskeletal: Positive for myalgias.  Neurological: Positive for seizures. Negative for headaches.       Objective:   Physical Exam  Constitutional: She is oriented to person, place, and time. She appears well-developed and well-nourished.  Cardiovascular: Normal rate, regular rhythm and normal heart sounds.   Pulmonary/Chest: Effort normal and breath sounds normal.  Abdominal: Soft.  Neurological: She is alert and oriented to person, place, and time. She has normal strength and normal reflexes. No cranial nerve deficit or sensory deficit.  Skin: Skin is warm and dry.      Assessment & Plan:

## 2013-07-10 NOTE — Assessment & Plan Note (Signed)
No head trauma, no focal findings. Advised patient against driving until she sees her neurologist. Wrote work excuse. Will obtain Keppra level since patient's regimen has been changed. May adjust regimen depending on results.

## 2013-07-11 LAB — LEVETIRACETAM LEVEL: Keppra (Levetiracetam): <1 ug/mL

## 2013-08-10 ENCOUNTER — Encounter: Payer: Self-pay | Admitting: Family Medicine

## 2013-08-10 ENCOUNTER — Ambulatory Visit (INDEPENDENT_AMBULATORY_CARE_PROVIDER_SITE_OTHER): Payer: BC Managed Care – HMO | Admitting: Family Medicine

## 2013-08-10 VITALS — BP 147/94 | HR 89 | Temp 98.6°F | Ht 67.0 in | Wt 223.0 lb

## 2013-08-10 DIAGNOSIS — R569 Unspecified convulsions: Secondary | ICD-10-CM

## 2013-08-10 DIAGNOSIS — B9789 Other viral agents as the cause of diseases classified elsewhere: Secondary | ICD-10-CM

## 2013-08-10 DIAGNOSIS — J069 Acute upper respiratory infection, unspecified: Secondary | ICD-10-CM

## 2013-08-10 LAB — CBC
HEMATOCRIT: 36.6 % (ref 36.0–46.0)
Hemoglobin: 12.1 g/dL (ref 12.0–15.0)
MCH: 25.4 pg — ABNORMAL LOW (ref 26.0–34.0)
MCHC: 33.1 g/dL (ref 30.0–36.0)
MCV: 76.7 fL — ABNORMAL LOW (ref 78.0–100.0)
Platelets: 333 10*3/uL (ref 150–400)
RBC: 4.77 MIL/uL (ref 3.87–5.11)
RDW: 15 % (ref 11.5–15.5)
WBC: 9.1 10*3/uL (ref 4.0–10.5)

## 2013-08-10 LAB — COMPREHENSIVE METABOLIC PANEL
ALT: 8 U/L (ref 0–35)
AST: 15 U/L (ref 0–37)
Albumin: 4 g/dL (ref 3.5–5.2)
Alkaline Phosphatase: 72 U/L (ref 39–117)
BUN: 7 mg/dL (ref 6–23)
CALCIUM: 9.1 mg/dL (ref 8.4–10.5)
CHLORIDE: 106 meq/L (ref 96–112)
CO2: 22 mEq/L (ref 19–32)
CREATININE: 0.75 mg/dL (ref 0.50–1.10)
GLUCOSE: 97 mg/dL (ref 70–99)
Potassium: 3.7 mEq/L (ref 3.5–5.3)
Sodium: 137 mEq/L (ref 135–145)
Total Bilirubin: 0.4 mg/dL (ref 0.2–1.2)
Total Protein: 7 g/dL (ref 6.0–8.3)

## 2013-08-10 MED ORDER — BENZONATATE 100 MG PO CAPS
100.0000 mg | ORAL_CAPSULE | Freq: Three times a day (TID) | ORAL | Status: DC | PRN
Start: 2013-08-10 — End: 2013-09-21

## 2013-08-10 NOTE — Assessment & Plan Note (Signed)
Seizure activity: appear to be stress and sleep deprivation related. Home today and tomorrow. Return to work on Thursday 08/12/13. Blood work (CBC and CMP). You will be called with results.

## 2013-08-10 NOTE — Patient Instructions (Signed)
Ms. Tina Wright,  Thank you for coming in today.  1. Seizure activity: appear to be stress and sleep deprivation related. Home today and tomorrow. Return to work on Thursday 08/12/13. Blood work. You will be called with results.   2. Viral URI with cough For this please do the following:  1. Drink plenty of fluids. Hot tea, soup etc will help open your nasal passages. 2. Tessalon perles for cough suppression 3. Tylenol as needed for discomfort.   F/u for chest pain, shortness of breath, persistent high fever, symptoms lasting longer than 10 days.   Dr. Armen PickupFunches

## 2013-08-10 NOTE — Assessment & Plan Note (Signed)
Viral URI with cough For this please do the following:  1. Drink plenty of fluids. Hot tea, soup etc will help open your nasal passages. 2. Tessalon perles for cough suppression 3. Tylenol as needed for discomfort.   F/u for chest pain, shortness of breath, persistent high fever, symptoms lasting longer than 10 days.

## 2013-08-10 NOTE — Progress Notes (Signed)
   Subjective:    Patient ID: Tina Wright, female    DOB: 02-23-1971, 43 y.o.   MRN: [id number] CC: seizures  HPI 43 yo F with history of seizure activity and bipolar disorder presents for SD visit with complaint of seizure activity last night. While in bed watching a movie with her son. Her son noted jerking and then the patient fell asleep. Today she feels soreness in both legs. Otherwise well except for URI symptoms. She admits to sleep deprivation and increase stress over the past week in preparation for her son's HS graduation. She was working overtime and had family in from out of town. Compliant with keppra. Denise IDU.   2. URI symptoms: cough, sneezing, runny nose x 2 days. No known sick contacts. No fever, chills, congestion. Cough is non productive.   Soc hx: non smoker  Review of Systems As per HPI     Objective:   Physical Exam BP 147/94  Pulse 89  Temp(Src) 98.6 F (37 C) (Oral)  Ht 5\' 7"  (1.702 m)  Wt 223 lb (101.152 kg)  BMI 34.92 kg/m2 General appearance: alert, cooperative and no distress Head: Normocephalic, without obvious abnormality, atraumatic Eyes: conjunctivae/corneas clear. PERRL, EOM's intact.  Ears: normal exam both ears Nose: mild nasal discharge Throat: normal oropharynx  Lungs: clear to auscultation bilaterally Heart: regular rate and rhythm, S1, S2 normal, no murmur, click, rub or gallop Neurologic: Alert and oriented X 3, normal strength and tone. Normal symmetric reflexes. Normal coordination and gait reports decrease sensation and tingling sensation on L side of body compared to R (consistent with previous post ictal states)     Assessment & Plan:

## 2013-08-12 ENCOUNTER — Telehealth: Payer: Self-pay | Admitting: *Deleted

## 2013-08-12 ENCOUNTER — Encounter: Payer: Self-pay | Admitting: *Deleted

## 2013-08-12 NOTE — Telephone Encounter (Signed)
Letter mailed. [name],CMA  

## 2013-08-12 NOTE — Telephone Encounter (Signed)
Message copied by Henri MedalHARTSELL, JAZMIN M on Thu Aug 12, 2013  4:22 PM ------      Message from: Dessa PhiFUNCHES, JOSALYN      Created: Thu Aug 12, 2013  3:55 PM       Normal labs obtained in the setting of seizure.       Please inform patient. ------

## 2013-09-01 ENCOUNTER — Encounter: Payer: Self-pay | Admitting: Family Medicine

## 2013-09-01 ENCOUNTER — Ambulatory Visit (INDEPENDENT_AMBULATORY_CARE_PROVIDER_SITE_OTHER): Payer: Non-veteran care | Admitting: Family Medicine

## 2013-09-01 VITALS — BP 142/82 | HR 85 | Temp 98.2°F | Ht 67.0 in | Wt 223.0 lb

## 2013-09-01 DIAGNOSIS — M7989 Other specified soft tissue disorders: Secondary | ICD-10-CM | POA: Diagnosis not present

## 2013-09-01 DIAGNOSIS — R569 Unspecified convulsions: Secondary | ICD-10-CM

## 2013-09-01 DIAGNOSIS — G4700 Insomnia, unspecified: Secondary | ICD-10-CM

## 2013-09-01 DIAGNOSIS — R2243 Localized swelling, mass and lump, lower limb, bilateral: Secondary | ICD-10-CM

## 2013-09-01 MED ORDER — ARIPIPRAZOLE 5 MG PO TABS: 10.0000 mg | ORAL_TABLET | Freq: Every day | ORAL | Status: AC

## 2013-09-01 NOTE — Progress Notes (Signed)
   Subjective:    Patient ID: Tina Wright, female    DOB: 03/13/1970, 43 y.o.   MRN: [id number]  HPI 43 year old female presents today with complaints of seizure activity, insomnia and extremity swelling.  1) Seizure - Patient reports recent increased seizure activity. - She reports a witnessed seizure on 7/4.  Per patient, she was sleeping and her son noted jerking movements and called her mother to witness.  Activity quickly resolved and she was awakened and given her Keppra. - Patient reports compliance with Keppra. - Recent trigger likely secondary to difficulty sleeping and stress.  2) Swelling - Patient reports recent swelling, particularly of her feet. - No associated chest pain, SOB. - She does report associated whole body pain. - No recent trauma, fall, injury  3) Insomnia - Patient now having difficulty falling asleep. - She has been compliant with psychiatric medications but still having significant difficulty. - She reports frequent "racing thoughts" which is contributing to her inability to fall asleep.  Social Hx: History  Substance Use Topics  . Smoking status: Never Smoker   . Smokeless tobacco: Never Used  . Alcohol Use: Yes     Comment: occassional wine   Review of Systems Per HPI    Objective:   Physical Exam Filed Vitals:   09/01/13 1459  BP: 142/82  Pulse: 85  Temp: 98.2 F (36.8 C)   Exam: General: well developed, well nourished; NAD. Cardiovascular: RRR. No murmurs, rubs, or gallops. Respiratory: CTAB. No rales, rhonchi, or wheeze. Abdomen: soft, nontender, nondistended. Extremities: No LE edema. Psych: Flat affect noted. Normal speech and thought process. Neuro: No focal deficits. AO x 3.      Assessment & Plan:  See Problem List  A total of 25 minutes were spent face-to-face with the patient during this encounter and over half of that time was spent on counseling and coordination of care.

## 2013-09-01 NOTE — Assessment & Plan Note (Signed)
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 Lucianne MussKumar). Patient complaint w/ Keppra but seems confused about current dosing (she did not bring her medications today).  She states that she thinks she is now taking 750 mg TID. I advised her to call her neurologist to see if she can be sooner (current appt is Aug. 12th).  I did not make any adjustments/changes given current large dose of Keppra.

## 2013-09-01 NOTE — Patient Instructions (Signed)
It was nice to see you today.  Please try and get in touch with your neurologist (I will try to do the same) to see if you can be seen sooner.  I am increasing your abilify to 10 mg.  Please be sure to follow up with your PCP at the [facility].  In regards to your swelling, I do not see any concerning findings on exam.  Follow up closely with your PCP at the [facility].

## 2013-09-01 NOTE — Assessment & Plan Note (Signed)
No edema noted on exam. No current signs of volume overload. Patient reassured today.

## 2013-09-01 NOTE — Assessment & Plan Note (Signed)
Likely secondary to underlying stress and mood disorder. Abilify increased to 10 mg QHS.

## 2013-09-21 ENCOUNTER — Emergency Department (HOSPITAL_COMMUNITY): Payer: Non-veteran care

## 2013-09-21 ENCOUNTER — Emergency Department (INDEPENDENT_AMBULATORY_CARE_PROVIDER_SITE_OTHER)
Admission: EM | Admit: 2013-09-21 | Discharge: 2013-09-21 | Disposition: A | Discharge status: Nursing Home (Includes ICF) | Payer: Non-veteran care | Source: Home / Self Care

## 2013-09-21 ENCOUNTER — Encounter (HOSPITAL_COMMUNITY): Payer: Self-pay | Admitting: Emergency Medicine

## 2013-09-21 ENCOUNTER — Emergency Department (HOSPITAL_COMMUNITY)
Admission: EM | Admit: 2013-09-21 | Discharge: 2013-09-21 | Disposition: A | Discharge status: Home / Self Care | Payer: Non-veteran care | Attending: Emergency Medicine | Admitting: Emergency Medicine

## 2013-09-21 DIAGNOSIS — R079 Chest pain, unspecified: Secondary | ICD-10-CM | POA: Diagnosis not present

## 2013-09-21 DIAGNOSIS — IMO0002 Reserved for concepts with insufficient information to code with codable children: Secondary | ICD-10-CM | POA: Insufficient documentation

## 2013-09-21 DIAGNOSIS — Z79899 Other long term (current) drug therapy: Secondary | ICD-10-CM | POA: Insufficient documentation

## 2013-09-21 DIAGNOSIS — I10 Essential (primary) hypertension: Secondary | ICD-10-CM | POA: Insufficient documentation

## 2013-09-21 DIAGNOSIS — F319 Bipolar disorder, unspecified: Secondary | ICD-10-CM | POA: Insufficient documentation

## 2013-09-21 DIAGNOSIS — F4310 Post-traumatic stress disorder, unspecified: Secondary | ICD-10-CM | POA: Insufficient documentation

## 2013-09-21 DIAGNOSIS — G43909 Migraine, unspecified, not intractable, without status migrainosus: Secondary | ICD-10-CM | POA: Insufficient documentation

## 2013-09-21 DIAGNOSIS — Z91040 Latex allergy status: Secondary | ICD-10-CM | POA: Insufficient documentation

## 2013-09-21 DIAGNOSIS — Z880 Allergy status to penicillin: Secondary | ICD-10-CM | POA: Insufficient documentation

## 2013-09-21 DIAGNOSIS — G40909 Epilepsy, unspecified, not intractable, without status epilepticus: Secondary | ICD-10-CM | POA: Insufficient documentation

## 2013-09-21 DIAGNOSIS — R0789 Other chest pain: Secondary | ICD-10-CM

## 2013-09-21 DIAGNOSIS — R071 Chest pain on breathing: Secondary | ICD-10-CM | POA: Insufficient documentation

## 2013-09-21 LAB — CBC WITH DIFFERENTIAL/PLATELET
Basophils Absolute: 0 10*3/uL (ref 0.0–0.1)
Basophils Relative: 0 % (ref 0–1)
Eosinophils Absolute: 0.1 10*3/uL (ref 0.0–0.7)
Eosinophils Relative: 1 % (ref 0–5)
HCT: 37.1 % (ref 36.0–46.0)
Hemoglobin: 11.6 g/dL — ABNORMAL LOW (ref 12.0–15.0)
LYMPHS ABS: 2.7 10*3/uL (ref 0.7–4.0)
LYMPHS PCT: 32 % (ref 12–46)
MCH: 24.9 pg — ABNORMAL LOW (ref 26.0–34.0)
MCHC: 31.3 g/dL (ref 30.0–36.0)
MCV: 79.6 fL (ref 78.0–100.0)
Monocytes Absolute: 0.6 10*3/uL (ref 0.1–1.0)
Monocytes Relative: 7 % (ref 3–12)
Neutro Abs: 5.2 10*3/uL (ref 1.7–7.7)
Neutrophils Relative %: 60 % (ref 43–77)
Platelets: 310 10*3/uL (ref 150–400)
RBC: 4.66 MIL/uL (ref 3.87–5.11)
RDW: 14.9 % (ref 11.5–15.5)
WBC: 8.7 10*3/uL (ref 4.0–10.5)

## 2013-09-21 LAB — BASIC METABOLIC PANEL
Anion gap: 12 (ref 5–15)
BUN: 9 mg/dL (ref 6–23)
CALCIUM: 9.3 mg/dL (ref 8.4–10.5)
CO2: 24 meq/L (ref 19–32)
Chloride: 103 mEq/L (ref 96–112)
Creatinine, Ser: 0.74 mg/dL (ref 0.50–1.10)
GFR calc Af Amer: >90 mL/min (ref >90)
GFR calc non Af Amer: >90 mL/min (ref >90)
GLUCOSE: 74 mg/dL (ref 70–99)
Potassium: 4.2 mEq/L (ref 3.7–5.3)
Sodium: 139 mEq/L (ref 137–147)

## 2013-09-21 LAB — D-DIMER, QUANTITATIVE (NOT AT ARMC): D DIMER QUANT: 0.69 ug{FEU}/mL — AB (ref 0.00–0.48)

## 2013-09-21 LAB — TROPONIN I

## 2013-09-21 MED ORDER — HYDROCODONE-ACETAMINOPHEN 5-325 MG PO TABS
2.0000 | ORAL_TABLET | Freq: Once | ORAL | Status: AC
  Administered 2013-09-21: 2 via ORAL
  Filled 2013-09-21: qty 2

## 2013-09-21 MED ORDER — IOHEXOL 350 MG/ML SOLN
100.0000 mL | Freq: Once | INTRAVENOUS | Status: AC | PRN
  Administered 2013-09-21: 100 mL via INTRAVENOUS

## 2013-09-21 MED ORDER — SODIUM CHLORIDE 0.9 % IV SOLN
Freq: Once | INTRAVENOUS | Status: AC
  Administered 2013-09-21: 16:00:00 via INTRAVENOUS

## 2013-09-21 MED ORDER — TRAMADOL HCL 50 MG PO TABS: 50.0000 mg | ORAL_TABLET | Freq: Four times a day (QID) | ORAL | Status: AC | PRN

## 2013-09-21 NOTE — ED Provider Notes (Signed)
CSN: [id number]     Arrival date & time 09/21/13  1416 History   First MD Initiated Contact with Patient 09/21/13 1521     Chief Complaint  Patient presents with  . Chest Pain   (Consider location/radiation/quality/duration/timing/severity/associated sxs/prior Treatment) HPI Comments: 43 year old female presents with a recent history of left anterior chest tightness and numbness with radiation and pain in the left arm. The symptoms lasted approximately 2 hours after which she vomited twice. Denies having associated diaphoresis but did have mild shortness of breath. She has a history of hypertension, seizure disorder, bipolar 1 disorder, PTSD, multiple sclerosis, sleep apnea and migraine headaches. During the interview process the patient continued to fall asleep and had to be shaken to awake her. She denies having pain at this time.  Patient is a 43 y.o. female presenting with chest pain.  Chest Pain Associated symptoms: fatigue, nausea, shortness of breath and vomiting   Associated symptoms: no cough and no fever     Past Medical History  Diagnosis Date  . Seizure   . Bipolar 1 disorder   . PTSD (post-traumatic stress disorder)   . Multiple sclerosis   . Sleep apnea   . Hypertension   . Migraine headache    Past Surgical History  Procedure Laterality Date  . Abdominal hysterectomy    . Cesarean section    . Hemorrhoid surgery    . Breast reduction surgery     Family History  Problem Relation Age of Onset  . Diabetes Mother   . Hypertension Mother   . Diabetes Father   . Hypertension Father    History  Substance Use Topics  . Smoking status: Never Smoker   . Smokeless tobacco: Never Used  . Alcohol Use: Yes     Comment: occassional wine   OB History   Grav Para Term Preterm Abortions TAB SAB Ect Mult Living            3     Review of Systems  Constitutional: Positive for activity change and fatigue. Negative for fever.  HENT: Negative.   Respiratory: Positive  for chest tightness and shortness of breath. Negative for cough.   Cardiovascular: Positive for chest pain. Negative for leg swelling.  Gastrointestinal: Positive for nausea and vomiting.  Skin: Negative.     Allergies  Aspirin; Penicillins; Latex; and Strawberry  Home Medications   Prior to Admission medications   Medication Sig Start Date End Date Taking? Authorizing Provider  ARIPiprazole (ABILIFY) 5 MG tablet Take 2 tablets (10 mg total) by mouth at bedtime. 09/01/13   Tommie SamsJayce G Cook, DO  benzonatate (TESSALON) 100 MG capsule Take 1 capsule (100 mg total) by mouth 3 (three) times daily as needed for cough. 08/10/13   Lora PaulaJosalyn C Funches, MD  Cholecalciferol (VITAMIN D PO) Take 1 tablet by mouth at bedtime.     Historical Provider, MD  hydrocortisone cream 1 % Apply 1 application topically 2 (two) times daily as needed for itching.    Historical Provider, MD  ibuprofen (ADVIL,MOTRIN) 200 MG tablet Take 400 mg by mouth every 8 (eight) hours as needed for pain.    Historical Provider, MD  levETIRAcetam (KEPPRA) 250 MG tablet Take 500 mg by mouth 3 (three) times daily.    Historical Provider, MD  lithium carbonate 300 MG capsule Take 600 mg by mouth 3 (three) times daily with meals.     Historical Provider, MD  metoprolol (LOPRESSOR) 100 MG tablet Take 100 mg by mouth 2 (  two) times daily.      Historical Provider, MD   BP 120/65  Pulse 78  Temp(Src) 98.1 F (36.7 C) (Oral)  Resp 20  SpO2 98% Physical Exam  Nursing note and vitals reviewed. Constitutional: She is oriented to person, place, and time. She appears well-developed and well-nourished. No distress.  Eyes: Conjunctivae and EOM are normal.  Neck: Normal range of motion. Neck supple.  Cardiovascular: Normal rate, regular rhythm, normal heart sounds and intact distal pulses.   Pulmonary/Chest: Effort normal and breath sounds normal. No respiratory distress. She has no wheezes. She has no rales.  Musculoskeletal: She exhibits no  edema.  Neurological: She is alert and oriented to person, place, and time.  Pt sleeping upon entering room and gently shaken to wake her. While questioning her she continued to fall asleep having to touch her to keep her awake.  Skin: Skin is warm and dry.    ED Course  Procedures (including critical care time) Labs Review Labs Reviewed - No data to display  Imaging Review No results found. EKG: NSR, no ectopy, no ischemic changes.   MDM   1. Chest pain, unspecified chest pain type     Transfer to ED for evaluation of chest pain.     Hayden Rasmussen, NP 09/21/13 1540  Hayden Rasmussen, NP 09/21/13 [phone]

## 2013-09-21 NOTE — ED Provider Notes (Signed)
CSN: [id number]     Arrival date & time 09/21/13  1637 History   First MD Initiated Contact with Patient 09/21/13 1643     Chief Complaint  Patient presents with  . Chest Pain     (Consider location/radiation/quality/duration/timing/severity/associated sxs/prior Treatment) HPI Comments: Patient presents to the ER [facility] Patient presented there today for sudden onset of sharp left-sided chest pain. Patient reports that is in the upper left chest and has been constant. Initially it was very severe, it has eased up and become more dull. She is not expressing any shortness of breath. Patient reports "tingling" in the left arm that began at the same time as the chest pain.  Patient denies any personal history of coronary artery disease. She does have a history of hypertension. Her father had a heart attack at the age of 260, no other members of her family with heart disease.  Patient is a 43 y.o. female presenting with chest pain.  Chest Pain   Past Medical History  Diagnosis Date  . Seizure   . Bipolar 1 disorder   . PTSD (post-traumatic stress disorder)   . Multiple sclerosis   . Sleep apnea   . Hypertension   . Migraine headache    Past Surgical History  Procedure Laterality Date  . Abdominal hysterectomy    . Cesarean section    . Hemorrhoid surgery    . Breast reduction surgery     Family History  Problem Relation Age of Onset  . Diabetes Mother   . Hypertension Mother   . Diabetes Father   . Hypertension Father    History  Substance Use Topics  . Smoking status: Never Smoker   . Smokeless tobacco: Never Used  . Alcohol Use: Yes     Comment: occassional wine   OB History   Grav Para Term Preterm Abortions TAB SAB Ect Mult Living            3     Review of Systems  Cardiovascular: Positive for chest pain.  All other systems reviewed and are negative.     Allergies  Aspirin; Latex; Penicillins; and Strawberry  Home  Medications   Prior to Admission medications   Medication Sig Start Date End Date Taking? Authorizing Provider  ARIPiprazole (ABILIFY) 5 MG tablet Take 2 tablets (10 mg total) by mouth at bedtime. 09/01/13  Yes Tommie SamsJayce G Cook, DO  Cholecalciferol (VITAMIN D PO) Take 1 tablet by mouth at bedtime.    Yes Historical Provider, MD  hydrocortisone cream 1 % Apply 1 application topically 2 (two) times daily as needed for itching.   Yes Historical Provider, MD  levETIRAcetam (KEPPRA) 250 MG tablet Take 750 mg by mouth 3 (three) times daily.    Yes Historical Provider, MD  lithium carbonate 300 MG capsule Take 600 mg by mouth 3 (three) times daily with meals.    Yes Historical Provider, MD  metoprolol (LOPRESSOR) 100 MG tablet Take 100 mg by mouth 2 (two) times daily.     Yes Historical Provider, MD  traMADol (ULTRAM) 50 MG tablet Take 1 tablet (50 mg total) by mouth every 6 (six) hours as needed. 09/21/13   Gilda Creasehristopher J. Sanii Kukla, MD   BP 147/87  Pulse 70  Resp 14  SpO2 100% Physical Exam  Constitutional: She is oriented to person, place, and time. She appears well-developed and well-nourished. No distress.  HENT:  Head: Normocephalic and atraumatic.  Right Ear: Hearing  normal.  Left Ear: Hearing normal.  Nose: Nose normal.  Mouth/Throat: Oropharynx is clear and moist and mucous membranes are normal.  Eyes: Conjunctivae and EOM are normal. Pupils are equal, round, and reactive to light.  Neck: Normal range of motion. Neck supple.  Cardiovascular: Regular rhythm, S1 normal and S2 normal.  Exam reveals no gallop and no friction rub.   No murmur heard. Pulmonary/Chest: Effort normal and breath sounds normal. No respiratory distress. She exhibits tenderness (Point tenderness at the site of pain in the left upper chest wall).    Abdominal: Soft. Normal appearance and bowel sounds are normal. There is no hepatosplenomegaly. There is no tenderness. There is no rebound, no guarding, no tenderness at  McBurney's point and negative Murphy's sign. No hernia.  Musculoskeletal: Normal range of motion.  Neurological: She is alert and oriented to person, place, and time. She has normal strength. No cranial nerve deficit or sensory deficit. Coordination normal. GCS eye subscore is 4. GCS verbal subscore is 5. GCS motor subscore is 6.  Skin: Skin is warm, dry and intact. No rash noted. No cyanosis.  Psychiatric: She has a normal mood and affect. Her speech is normal and behavior is normal. Thought content normal.    ED Course  Procedures (including critical care time) Labs Review Labs Reviewed  CBC WITH DIFFERENTIAL - Abnormal; Notable for the following:    Hemoglobin 11.6 (*)    MCH 24.9 (*)    All other components within normal limits  D-DIMER, QUANTITATIVE - Abnormal; Notable for the following:    D-Dimer, Quant 0.69 (*)    All other components within normal limits  BASIC METABOLIC PANEL  TROPONIN I    Imaging Review Ct Angio Chest Pe W/cm &/or Wo Cm  09/21/2013   CLINICAL DATA:  Chest pain  EXAM: CT ANGIOGRAPHY CHEST WITH CONTRAST  TECHNIQUE: Multidetector CT imaging of the chest was performed using the standard protocol during bolus administration of intravenous contrast. Multiplanar CT image reconstructions and MIPs were obtained to evaluate the vascular anatomy.  CONTRAST:  OMNIPAQUE IOHEXOL 350 MG/ML SOLN  COMPARISON:  Prior radiograph from 10/08/2012  FINDINGS: Thyroid gland is unremarkable.  No pathologically enlarged mediastinal, hilar, or axillary lymph nodes are identified.  Intrathoracic aorta is of normal caliber and appearance. Great vessels normal. Apparent right subclavian artery noted.  Heart size within normal limits.  No pericardial effusion.  Pulmonary arterial tree is well opacified. No filling defect to suggest acute pulmonary embolism identified. Re-formatted imaging confirms these findings.  The lungs are clear without focal infiltrate or pulmonary edema. Mild  bibasilar subsegmental atelectasis present within the lower lobes dependently. No pneumothorax. A 5 mm nodular opacity along the right major fissure is favored to reflect an intra pulmonic lymph node. There is an additional 4 mm nodule within the left lower lobe (series 6, image 48).  Visualized upper abdomen is within normal limits.  No acute osseus abnormality. No worrisome lytic or blastic osseous lesions.  IMPRESSION: 1. No CT evidence of acute pulmonary embolism. 2. No other acute cardiopulmonary abnormality identified. 3. 4 mm left lower lobe nodule, indeterminate. If the patient is at high risk for bronchogenic carcinoma, follow-up chest CT at 1 year is recommended. If the patient is at low risk, no follow-up is needed. This recommendation follows the consensus statement: Guidelines for Management of Small Pulmonary Nodules Detected on CT Scans: A Statement from the Fleischner Society as published in Radiology 2005; 237:395-400. 4. Additional 5 mm nodule  along the right major fissure, favored to reflect a normal intra pulmonic lymph node.   Electronically Signed   By: Rise Mu M.D.   On: 09/21/2013 19:57     EKG Interpretation   Date/Time:  Tuesday September 21 2013 16:46:27 EDT Ventricular Rate:  80 PR Interval:  160 QRS Duration: 75 QT Interval:  387 QTC Calculation: 446 R Axis:   26 Text Interpretation:  Age not entered, assumed to be  43 years old for  purpose of ECG interpretation Sinus rhythm Low voltage, precordial leads  Otherwise within normal limits Confirmed by Caleb Decock  MD, Kenyotta Dorfman  [phone]) on 09/21/2013 4:55:56 PM      MDM   Final diagnoses:  Chest wall pain    Patient presents with complaints of chest pain. Pain is very atypical. Pain came on at rest is not related to exertion. She is extremely tender in the area where the pain is, exhibiting reproducibility. EKG was normal. Troponin was negative. Patient did have a mildly elevated d-dimer, and therefore CT  angiogram was performed. No PE was identified. Patient reassured, will be treated with analgesia.    Gilda Crease, MD 09/22/13 [phone]

## 2013-09-21 NOTE — ED Notes (Signed)
Per EMS[facility] pt reports sudden onset of 6/10 left sided chest "tightness" with radiation to left arm at 1200 today with dizziness. Pt denies SOB, N/V, diaphoresis. VSS, NSR 66. AO x4. NAD.

## 2013-09-21 NOTE — ED Notes (Signed)
Md Pollina at bedside.  

## 2013-09-21 NOTE — ED Notes (Signed)
[facility] does not have a truck 

## 2013-09-21 NOTE — ED Notes (Signed)
Notified [facility] 

## 2013-09-21 NOTE — ED Notes (Signed)
Chest tightness, pain into left arm and tingling in left forearm and hand.  Onset 12:30pm today.  Asked if she had felt this way before-said  yes and was told it was anxiety.

## 2013-09-21 NOTE — Discharge Instructions (Signed)

## 2013-09-21 NOTE — ED Provider Notes (Signed)
Medical screening examination/treatment/procedure(s) were performed by resident physician or non-physician practitioner and as supervising physician I was immediately available for consultation/collaboration.   Kaitlyn Franko DOUGLAS MD.   Ashantee Deupree D Kalliopi Coupland, MD 09/21/13 1657 

## 2013-10-04 ENCOUNTER — Emergency Department (HOSPITAL_COMMUNITY): Payer: Non-veteran care

## 2013-10-04 ENCOUNTER — Emergency Department (HOSPITAL_COMMUNITY)
Admission: EM | Admit: 2013-10-04 | Discharge: 2013-10-05 | Disposition: A | Discharge status: Home / Self Care | Payer: Non-veteran care | Attending: Emergency Medicine | Admitting: Emergency Medicine

## 2013-10-04 ENCOUNTER — Encounter (HOSPITAL_COMMUNITY): Payer: Self-pay | Admitting: Emergency Medicine

## 2013-10-04 DIAGNOSIS — Z880 Allergy status to penicillin: Secondary | ICD-10-CM | POA: Insufficient documentation

## 2013-10-04 DIAGNOSIS — Z8709 Personal history of other diseases of the respiratory system: Secondary | ICD-10-CM | POA: Insufficient documentation

## 2013-10-04 DIAGNOSIS — I10 Essential (primary) hypertension: Secondary | ICD-10-CM | POA: Insufficient documentation

## 2013-10-04 DIAGNOSIS — F319 Bipolar disorder, unspecified: Secondary | ICD-10-CM | POA: Insufficient documentation

## 2013-10-04 DIAGNOSIS — Z79899 Other long term (current) drug therapy: Secondary | ICD-10-CM | POA: Insufficient documentation

## 2013-10-04 DIAGNOSIS — R0789 Other chest pain: Secondary | ICD-10-CM

## 2013-10-04 DIAGNOSIS — Z91040 Latex allergy status: Secondary | ICD-10-CM | POA: Insufficient documentation

## 2013-10-04 DIAGNOSIS — G43909 Migraine, unspecified, not intractable, without status migrainosus: Secondary | ICD-10-CM | POA: Insufficient documentation

## 2013-10-04 DIAGNOSIS — R079 Chest pain, unspecified: Secondary | ICD-10-CM | POA: Insufficient documentation

## 2013-10-04 DIAGNOSIS — R071 Chest pain on breathing: Secondary | ICD-10-CM | POA: Insufficient documentation

## 2013-10-04 LAB — BASIC METABOLIC PANEL
Anion gap: 13 (ref 5–15)
BUN: 7 mg/dL (ref 6–23)
CALCIUM: 9.3 mg/dL (ref 8.4–10.5)
CO2: 23 mEq/L (ref 19–32)
Chloride: 101 mEq/L (ref 96–112)
Creatinine, Ser: 0.71 mg/dL (ref 0.50–1.10)
GFR calc Af Amer: >90 mL/min (ref >90)
Glucose, Bld: 140 mg/dL — ABNORMAL HIGH (ref 70–99)
Potassium: 3.6 mEq/L — ABNORMAL LOW (ref 3.7–5.3)
SODIUM: 137 meq/L (ref 137–147)

## 2013-10-04 LAB — CBC
HCT: 34.7 % — ABNORMAL LOW (ref 36.0–46.0)
Hemoglobin: 11.5 g/dL — ABNORMAL LOW (ref 12.0–15.0)
MCH: 26.2 pg (ref 26.0–34.0)
MCHC: 33.1 g/dL (ref 30.0–36.0)
MCV: 79 fL (ref 78.0–100.0)
PLATELETS: 350 10*3/uL (ref 150–400)
RBC: 4.39 MIL/uL (ref 3.87–5.11)
RDW: 14.7 % (ref 11.5–15.5)
WBC: 9.3 10*3/uL (ref 4.0–10.5)

## 2013-10-04 LAB — LITHIUM LEVEL: Lithium Lvl: 2.4 mEq/L (ref 0.80–1.40)

## 2013-10-04 LAB — I-STAT TROPONIN, ED
TROPONIN I, POC: 0 ng/mL (ref 0.00–0.08)
Troponin i, poc: 0 ng/mL (ref 0.00–0.08)

## 2013-10-04 MED ORDER — TRAMADOL HCL 50 MG PO TABS: 50.0000 mg | ORAL_TABLET | Freq: Four times a day (QID) | ORAL | Status: AC | PRN

## 2013-10-04 MED ORDER — MORPHINE SULFATE 4 MG/ML IJ SOLN
4.0000 mg | Freq: Once | INTRAMUSCULAR | Status: AC
  Administered 2013-10-04: 4 mg via INTRAVENOUS
  Filled 2013-10-04: qty 1

## 2013-10-04 NOTE — ED Provider Notes (Signed)
Briefly pt is presenting with anterior L chest pain radiating to L arm, with pleurisy.  Has a family ho MI after age 43.  Chest pain is with reproducible character.  Additionally, pt is on lithium with intermittent dosing over last month, states that she has been stretching doses out.  She also has MS.  After labwork with negative delta troponin, lithium level revealed to be high, feel pt stable to dc home with pcp fu.  Informed her to hold dose of lithium pending redraw and PCP fu.    Medical screening examination/treatment/procedure(s) were conducted as a shared visit with non-physician practitioner(s) and myself.  I personally evaluated the patient during the encounter.   EKG Interpretation None        Mirian MoMatthew Gentry, MD 10/05/13 1459

## 2013-10-04 NOTE — ED Notes (Signed)
Pt waiting on sister to call her back about getting a ride. Pt still very lethargic.

## 2013-10-04 NOTE — ED Notes (Signed)
Pt very lethargic after pain medication. Pt informed that she cannot drive home and needs to have someone pick her up. Pt calling friends and family at this time.

## 2013-10-04 NOTE — ED Notes (Signed)
MD made aware of Pt's lithium level of 2.4.

## 2013-10-04 NOTE — ED Notes (Addendum)
Pt also reports bilateral leg pain and swelling x 2 days.

## 2013-10-04 NOTE — Discharge Instructions (Signed)
Your Lithium level today is high.  Stop taking the Lithium and follow up with your Primary Care Physician.  Chest Wall Pain Chest wall pain is pain in or around the bones and muscles of your chest. It may take up to 6 weeks to get better. It may take longer if you must stay physically active in your work and activities.  CAUSES  Chest wall pain may happen on its own. However, it may be caused by:  A viral illness like the flu.  Injury.  Coughing.  Exercise.  Arthritis.  Fibromyalgia.  Shingles. HOME CARE INSTRUCTIONS   Avoid overtiring physical activity. Try not to strain or perform activities that cause pain. This includes any activities using your chest or your abdominal and side muscles, especially if heavy weights are used.  Put ice on the sore area.  Put ice in a plastic bag.  Place a towel between your skin and the bag.  Leave the ice on for 15-20 minutes per hour while awake for the first 2 days.  Only take over-the-counter or prescription medicines for pain, discomfort, or fever as directed by your caregiver. SEEK IMMEDIATE MEDICAL CARE IF:   Your pain increases, or you are very uncomfortable.  You have a fever.  Your chest pain becomes worse.  You have new, unexplained symptoms.  You have nausea or vomiting.  You feel sweaty or lightheaded.  You have a cough with phlegm (sputum), or you cough up blood. MAKE SURE YOU:   Understand these instructions.  Will watch your condition.  Will get help right away if you are not doing well or get worse. Document Released: 02/11/2005 Document Revised: 05/06/2011 Document Reviewed: 10/08/2010 [facility] Patient Information 2015 [employer] [place]. This information is not intended to replace advice given to you by your health care provider. Make sure you discuss any questions you have with your health care provider.

## 2013-10-04 NOTE — ED Notes (Signed)
Pt reports chest pain with pain down her left arm. She reports pain is worse with when she takes a deep breath with pain 6/10. Pt is alert and oriented. NAD.

## 2013-10-04 NOTE — ED Provider Notes (Signed)
CSN: [id number]     Arrival date & time 10/04/13  1748 History   First MD Initiated Contact with Patient 10/04/13 2000     No chief complaint on file.    (Consider location/radiation/quality/duration/timing/severity/associated sxs/prior Treatment) HPI Comments: Patient presents today with a chief complaint of chest pain.  She reports that she began having the pain around 4:30 PM today while at rest doing homework.  She states that the pain has been constant since that time, but she has intermittently been having sharp pain that last a minute at a time.  Sharp pain also not associated with exertion.  She reports that she had not been getting chest pain when she has worked out recently.  Pain is located left anterior chest and she reports one episode when it radiated down her left arm.  She denies SOB, diaphoresis, nausea, vomiting, or abdominal pain.  She was seen for similar complaint of 09/22/13.  At that time she had a negative work up and was discharged home.  She had a CT angio chest at that time, which was negative for PE.  She denies any prolonged travel or surgeries in the past 4 weeks.  Denies any history of PE or DVT.  Denies any use of estrogen containing medications.  She denies any prior cardiac history.  She does have a history of HTN, but no history of DM or Hyperlipidemia.  She does not smoke.  She reports that her father had a MI in his 3760's, but no other family history.  The history is provided by the patient.    Past Medical History  Diagnosis Date  . Seizure   . Bipolar 1 disorder   . PTSD (post-traumatic stress disorder)   . Multiple sclerosis   . Sleep apnea   . Hypertension   . Migraine headache    Past Surgical History  Procedure Laterality Date  . Abdominal hysterectomy    . Cesarean section    . Hemorrhoid surgery    . Breast reduction surgery     Family History  Problem Relation Age of Onset  . Diabetes Mother   . Hypertension Mother   . Diabetes Father   .  Hypertension Father    History  Substance Use Topics  . Smoking status: Never Smoker   . Smokeless tobacco: Never Used  . Alcohol Use: Yes     Comment: occassional wine   OB History   Grav Para Term Preterm Abortions TAB SAB Ect Mult Living            3     Review of Systems  All other systems reviewed and are negative.     Allergies  Aspirin; Latex; Penicillins; and Strawberry  Home Medications   Prior to Admission medications   Medication Sig Start Date End Date Taking? Authorizing Provider  ARIPiprazole (ABILIFY) 5 MG tablet Take 2 tablets (10 mg total) by mouth at bedtime. 09/01/13   Tommie SamsJayce G Cook, DO  Cholecalciferol (VITAMIN D PO) Take 1 tablet by mouth at bedtime.     Historical Provider, MD  hydrocortisone cream 1 % Apply 1 application topically 2 (two) times daily as needed for itching.    Historical Provider, MD  levETIRAcetam (KEPPRA) 250 MG tablet Take 750 mg by mouth 3 (three) times daily.     Historical Provider, MD  lithium carbonate 300 MG capsule Take 600 mg by mouth 3 (three) times daily with meals.     Historical Provider, MD  metoprolol (LOPRESSOR)  100 MG tablet Take 100 mg by mouth 2 (two) times daily.      Historical Provider, MD  traMADol (ULTRAM) 50 MG tablet Take 1 tablet (50 mg total) by mouth every 6 (six) hours as needed. 09/21/13   Gilda Crease, MD   BP 126/73  Pulse 77  Temp(Src) 98.5 F (36.9 C) (Oral)  Resp 16  SpO2 100% Physical Exam  Nursing note and vitals reviewed. Constitutional: She appears well-developed and well-nourished. No distress.  HENT:  Head: Normocephalic and atraumatic.  Mouth/Throat: Oropharynx is clear and moist.  Neck: Normal range of motion. Neck supple.  Cardiovascular: Normal rate, regular rhythm and normal heart sounds.   Pulses:      Dorsalis pedis pulses are 2+ on the right side, and 2+ on the left side.  Pulmonary/Chest: Effort normal and breath sounds normal. No respiratory distress. She has no  wheezes. She has no rales. She exhibits tenderness.  Chest pain reproducible on exam  Abdominal: Soft. There is no tenderness.  Musculoskeletal: Normal range of motion.  No LE edema or erythema Negative Homan's sign bilaterally  Neurological: She is alert.  Skin: Skin is warm and dry. She is not diaphoretic.  Psychiatric: She has a normal mood and affect.    ED Course  Procedures (including critical care time) Labs Review Labs Reviewed  CBC - Abnormal; Notable for the following:    Hemoglobin 11.5 (*)    HCT 34.7 (*)    All other components within normal limits  BASIC METABOLIC PANEL - Abnormal; Notable for the following:    Potassium 3.6 (*)    Glucose, Bld 140 (*)    All other components within normal limits  I-STAT TROPOININ, ED    Imaging Review Dg Chest 2 View  10/04/2013   CLINICAL DATA:  Chest pain  EXAM: CHEST  2 VIEW  COMPARISON:  Multiple exams, including 09/21/2013  FINDINGS: Mildly enlarged cardiopericardial silhouette. No edema. No pleural effusion. The lungs appear otherwise clear. Minimal thoracic spondylosis.  IMPRESSION: 1. Mildly enlarged cardiopericardial silhouette.  No edema.   Electronically Signed   By: Herbie Baltimore M.D.   On: 10/04/2013 20:00     EKG Interpretation   Date/Time:  Monday October 04 2013 17:59:05 EDT Ventricular Rate:  77 PR Interval:  162 QRS Duration: 74 QT Interval:  378 QTC Calculation: 428 R Axis:   -3 Text Interpretation:  Sinus rhythm Low voltage, precordial leads No  significant change was found Confirmed by Mirian Mo [phone]) on  10/04/2013 8:25:15 PM     9:30 PM Reassessed patient.  She reports that her chest pain has improved at this time.  Mild tenderness to palpation of anterior chest.   MDM   Final diagnoses:  None   Patient presenting with chest pain.  Pain is reproducible on exam.  No association with exertion.  No ischemic changes on EKG.  Initial and 3 hour troponin negative.  HEART score of 1.   Patient is PERC negative.  CXR unremarkable.  VSS.  Pulse ox 99 on RA.  Pain improved after given Morphine.  Feel that the patient is stable for discharge.  Patient also evaluated by Dr. Littie Deeds who is in agreement with the plan.  Patient also found to have an elevated Lithium.  Patient instructed to stop the Lithium and follow up with PCP regarding this.  Return precautions given.    Santiago Glad, PA-C 10/05/13 0045

## 2013-10-05 NOTE — ED Notes (Signed)
Pt's sister is coming to get her.

## 2013-10-08 NOTE — ED Provider Notes (Signed)
Medical screening examination/treatment/procedure(s) were conducted as a shared visit with non-physician practitioner(s) and myself.  I personally evaluated the patient during the encounter.   EKG Interpretation   Date/Time:  Monday October 04 2013 17:59:05 EDT Ventricular Rate:  77 PR Interval:  162 QRS Duration: 74 QT Interval:  378 QTC Calculation: 428 R Axis:   -3 Text Interpretation:  Sinus rhythm Low voltage, precordial leads No  significant change was found Confirmed by Mirian MoGentry, Makenli Derstine [phone]([id number]) on  10/04/2013 8:25:15 PM      Briefly, pt is a 43 y.o. female who presented with chest pain, however this is very low risk via history and is exacerbated by chest pressure.  Likely MSK etiology.  She also complained of significant tingling in extremities, so a lithium level was checked and was supratherapeutic, despite the pt's reportedly decreased compliance.  She was instructed to dc lithium use until seen by her physician.    Mirian MoMatthew Orla Estrin, MD 10/08/13 [phone]

## 2013-10-19 ENCOUNTER — Ambulatory Visit (INDEPENDENT_AMBULATORY_CARE_PROVIDER_SITE_OTHER): Payer: Non-veteran care | Admitting: Family Medicine

## 2013-10-19 VITALS — BP 146/100 | HR 91 | Ht 67.0 in | Wt 223.0 lb

## 2013-10-19 DIAGNOSIS — G4700 Insomnia, unspecified: Secondary | ICD-10-CM

## 2013-10-19 DIAGNOSIS — F3132 Bipolar disorder, current episode depressed, moderate: Secondary | ICD-10-CM

## 2013-10-19 DIAGNOSIS — H532 Diplopia: Secondary | ICD-10-CM

## 2013-10-19 DIAGNOSIS — R2243 Localized swelling, mass and lump, lower limb, bilateral: Secondary | ICD-10-CM

## 2013-10-19 DIAGNOSIS — M7989 Other specified soft tissue disorders: Secondary | ICD-10-CM

## 2013-10-19 MED ORDER — TRAZODONE HCL 50 MG PO TABS: 25.0000 mg | ORAL_TABLET | Freq: Every evening | ORAL | Status: AC | PRN

## 2013-10-19 NOTE — Assessment & Plan Note (Addendum)
With limb sensory abnormalities, gait disturbance, and polysymptomatic relapsing-remitting course will evaluate for optic neuritis/MS with MRI. [facility]). Advised NOT to drive until further evaluation and management has been pursued.

## 2013-10-19 NOTE — Patient Instructions (Signed)
Thank you for coming in today!  I have ordered an MRI of your brain to investigate for MS. You will be contacted with these results.  - It is very important that you follow up with the neurologist at the [facility] to discuss not only seizures, but these symptoms you're having. - [facility]. - If you experience any thoughts of hurting yourself or others, you should be seen at an emergency room right away.   Take care and seek immediate care sooner if you develop any concerns.  Please feel free to call with any questions or concerns at any time, at [phone]. - Dr. Jarvis Newcomer

## 2013-10-19 NOTE — Assessment & Plan Note (Signed)
[facility] with [facility] in next week. Unable to afford medications at this time, so lithium and abilify have been under-dosed exacerbating her varied symptomatology. No SI/HI, but emotionally labile. No medication additions/changes at this time, but would recommend treating insomnia aggressively as well.

## 2013-10-19 NOTE — Assessment & Plan Note (Signed)
Equivocal on exam today - no focal swelling, and feet fitting in shoes, no rings on. No indications of cardiac, hepatic or renal etiology. Perhaps component of anxiety.

## 2013-10-19 NOTE — Assessment & Plan Note (Signed)
Would recommend trazodone or increasing abilify, but pt unable to afford ANY medications at this time.

## 2013-10-19 NOTE — Progress Notes (Signed)
Patient ID: MIRAYAH WREN, female   DOB: Feb 25, 1971, 43 y.o.   MRN: [id number]   Subjective:   EMMALIN JAQUESS is a 43 y.o. female with a history of bipolar disorder, seizure disorder, and insomnia here for multiple complaints.  Ms. 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 She called for SDA today after having approximately 30 minutes of double vision while driving. This has happened 1 time before about 2 weeks ago.    All her symptoms began a few months ago including global swelling and numbness/tingling/stabbing pains in her right arm and leg as well as numbness and tingling with "aching" severe pain in her left arm and leg.   Review of Systems:  Per HPI. All other systems reviewed and are negative.  Medications include: Abilify  qHS Keppra  TID Lithium  TID Objective:  BP 146/100  Pulse 91  Ht  (1.702 m)  Wt 223 lb (101.152 kg)  BMI 34.92 kg/m2  Gen:  43 y.o. female in NAD Neuro: Alert and oriented, CN II-XII intact with no evidence of internuclear ophthalmoplegia. No focal areas of motor or sensory deficits in extremities. Negative straight leg raise, negative spurling's, no lhermitte sign.  Psych: Neatly groomed and appropriately dressed. Maintains good eye contact and is cooperative and attentive. Speech is normal volume and rate. Mood is depressed and "very down" with a restricted affect. Thought process is logical and goal directed. No suicidal or homicidal ideation. Does not appear to be responding to any internal stimuli. Able to maintain train of thought and concentrate on the questions.   Brain MRI 05/18/2012 showed a single subcortical left frontal lobe hyperintensity on T2 scan Assessment:  YAQUELINE GUTTER is a 43  y.o. 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   Plan:  See problem list for problem-specific plans.  Case complicated by severe financial difficulties [has not been taking complete doses of anti-epileptics/mood stabilizers] combined with difficulty of care coordination between this outside office and her primary insurance/provider [employer]). Symptoms are prone to be exaggerated by under-treated comorbidities but are concerning for demyelinating process. [facility] and pt cannot afford to pay. Best plan at this point is to follow up as scheduled with [facility] providers ([facility]iatry and neurology). Pt is distraught but not a danger to herself or others.

## 2013-10-25 ENCOUNTER — Telehealth: Payer: Self-pay | Admitting: *Deleted

## 2013-10-25 NOTE — Telephone Encounter (Signed)
Left message to return call concerning prior approval for MRI.Riane Rung, [name]

## 2013-10-28 ENCOUNTER — Encounter: Payer: Self-pay | Admitting: Family Medicine

## 2013-10-28 NOTE — Telephone Encounter (Signed)
Called and spoke with patient, informed her that I am still currently working on the prior authorization for her MRI. Called and spoke with Thadeus at the [facility] call center at [phone]. He sent the request for MRI to her primary physician and they will contact me with an approval or denial for the MRI and I will call the patient back with the decision.Busick, [name]

## 2013-10-28 NOTE — Telephone Encounter (Signed)
Spoke with someone with the [facility], Dr Cheryle Horsfall staff, and was told to fax over notes and the request for the MRI and Dr Diona Foley would make a decision on whether or not to order the MRI. They will not approve for [place] to do it here, it must be done through the [place]. Faxed notes to Dr Diona Foley at [phone]. Patient has an appointment with her on 11/04/13 at 9 am. Called and informed patient of the above information.Tina Wright, [name]

## 2013-11-17 ENCOUNTER — Telehealth: Payer: Self-pay | Admitting: Family Medicine

## 2013-11-17 NOTE — Telephone Encounter (Signed)
Spoke with patient and informed her that I have sat OV note up front for pick up

## 2013-11-17 NOTE — Telephone Encounter (Signed)
Pt called and would like a print out of her visits from 06/2013 to present left up front for pick up. Please call when ready. jw

## 2017-06-24 ENCOUNTER — Encounter (HOSPITAL_COMMUNITY): Payer: Self-pay | Admitting: Emergency Medicine

## 2017-06-24 ENCOUNTER — Emergency Department (HOSPITAL_COMMUNITY)
Admission: EM | Admit: 2017-06-24 | Discharge: 2017-06-25 | Disposition: A | Discharge status: Home / Self Care | Payer: Non-veteran care | Attending: Emergency Medicine | Admitting: Emergency Medicine

## 2017-06-24 ENCOUNTER — Emergency Department (HOSPITAL_COMMUNITY): Payer: Non-veteran care

## 2017-06-24 ENCOUNTER — Other Ambulatory Visit: Payer: Self-pay

## 2017-06-24 DIAGNOSIS — I10 Essential (primary) hypertension: Secondary | ICD-10-CM | POA: Diagnosis not present

## 2017-06-24 DIAGNOSIS — Z91040 Latex allergy status: Secondary | ICD-10-CM | POA: Insufficient documentation

## 2017-06-24 DIAGNOSIS — R079 Chest pain, unspecified: Secondary | ICD-10-CM | POA: Diagnosis not present

## 2017-06-24 DIAGNOSIS — Z79899 Other long term (current) drug therapy: Secondary | ICD-10-CM | POA: Diagnosis not present

## 2017-06-24 LAB — I-STAT BETA HCG BLOOD, ED (MC, WL, AP ONLY)

## 2017-06-24 LAB — CBC
HCT: 37.9 % (ref 36.0–46.0)
Hemoglobin: 12.2 g/dL (ref 12.0–15.0)
MCH: 25.7 pg — ABNORMAL LOW (ref 26.0–34.0)
MCHC: 32.2 g/dL (ref 30.0–36.0)
MCV: 79.8 fL (ref 78.0–100.0)
PLATELETS: 371 10*3/uL (ref 150–400)
RBC: 4.75 MIL/uL (ref 3.87–5.11)
RDW: 14.3 % (ref 11.5–15.5)
WBC: 13 10*3/uL — AB (ref 4.0–10.5)

## 2017-06-24 LAB — BASIC METABOLIC PANEL
Anion gap: 11 (ref 5–15)
BUN: <5 mg/dL — ABNORMAL LOW (ref 6–20)
CALCIUM: 9.5 mg/dL (ref 8.9–10.3)
CO2: 24 mmol/L (ref 22–32)
CREATININE: 0.77 mg/dL (ref 0.44–1.00)
Chloride: 102 mmol/L (ref 101–111)
GFR calc non Af Amer: >60 mL/min (ref >60)
Glucose, Bld: 116 mg/dL — ABNORMAL HIGH (ref 65–99)
Potassium: 3.6 mmol/L (ref 3.5–5.1)
SODIUM: 137 mmol/L (ref 135–145)

## 2017-06-24 LAB — I-STAT TROPONIN, ED: Troponin i, poc: 0.01 ng/mL (ref 0.00–0.08)

## 2017-06-24 LAB — TROPONIN I

## 2017-06-24 MED ORDER — DIPHENHYDRAMINE HCL 50 MG/ML IJ SOLN
25.0000 mg | Freq: Once | INTRAMUSCULAR | Status: AC
  Administered 2017-06-24: 25 mg via INTRAVENOUS
  Filled 2017-06-24: qty 1

## 2017-06-24 MED ORDER — LACTATED RINGERS IV BOLUS
1000.0000 mL | Freq: Once | INTRAVENOUS | Status: AC
  Administered 2017-06-24: 1000 mL via INTRAVENOUS

## 2017-06-24 MED ORDER — METOCLOPRAMIDE HCL 5 MG/ML IJ SOLN
10.0000 mg | Freq: Once | INTRAMUSCULAR | Status: AC
  Administered 2017-06-24: 10 mg via INTRAVENOUS
  Filled 2017-06-24: qty 2

## 2017-06-24 MED ORDER — DEXAMETHASONE SODIUM PHOSPHATE 10 MG/ML IJ SOLN
10.0000 mg | Freq: Once | INTRAMUSCULAR | Status: AC
  Administered 2017-06-24: 10 mg via INTRAVENOUS
  Filled 2017-06-24: qty 1

## 2017-06-24 NOTE — ED Triage Notes (Signed)
Pt states she has been having elevated BP. Pt has also been having CP for a few hours. Pt states she has had nausea, denies vomiting.

## 2017-06-24 NOTE — ED Notes (Signed)
ED Provider at bedside. 

## 2017-06-24 NOTE — ED Provider Notes (Addendum)
[facility] EMERGENCY DEPARTMENT Provider Note   CSN: [id number] Arrival date & time: 06/24/17  1646     History   Chief Complaint Chief Complaint  Patient presents with  . Chest Pain    HPI SRESHTA CRESSLER is a 47 y.o. female.   Chest Pain   This is a new problem. The current episode started 3 to 5 hours ago. The problem occurs constantly. The problem has been resolved. The pain is present in the substernal region. The pain is mild. The quality of the pain is described as brief. Associated symptoms include headaches. Pertinent negatives include no abdominal pain, no cough and no syncope. She has tried nothing for the symptoms.  Her past medical history is significant for hypertension.  Hypertension  Associated symptoms include chest pain and headaches. Pertinent negatives include no abdominal pain.    Past Medical History:  Diagnosis Date  . Bipolar 1 disorder (HCC)   . Hypertension   . Migraine headache   . Multiple sclerosis (HCC)   . PTSD (post-traumatic stress disorder)   . Seizure (HCC)   . Sleep apnea     Patient Active Problem List   Diagnosis Date Noted  . Diplopia 10/19/2013  . Insomnia 09/01/2013  . Localized swelling of both lower legs 09/01/2013  . Seizure (HCC) 04/28/2013  . BIPOLAR AFFECTIVE DISORDER, DEPRESSED, MODERATE 08/24/2008  . MIGRAINE HEADACHE 08/24/2008  . HYPERTENSION, BENIGN ESSENTIAL 08/24/2008  . OBESITY 06/01/2008    Past Surgical History:  Procedure Laterality Date  . ABDOMINAL HYSTERECTOMY    . BREAST REDUCTION SURGERY    . CESAREAN SECTION    . HEMORRHOID SURGERY       OB History    Gravida      Para      Term      Preterm      AB      Living  3     SAB      TAB      Ectopic      Multiple      Live Births               Home Medications    Prior to Admission medications   Medication Sig Start Date End Date Taking? Authorizing Provider  ARIPiprazole (ABILIFY) 5 MG tablet Take 2  tablets (10 mg total) by mouth at bedtime. 09/01/13   Tommie Sams, DO  Cholecalciferol (VITAMIN D PO) Take 1 tablet by mouth at bedtime.     [provider]  hydrocortisone cream 1 % Apply 1 application topically 2 (two) times daily as needed for itching.    [provider]  ibuprofen (ADVIL,MOTRIN) 200 MG tablet Take 200 mg by mouth every 6 (six) hours as needed for moderate pain.    [provider]  levETIRAcetam (KEPPRA) 250 MG tablet Take 750 mg by mouth 3 (three) times daily.     [provider]  lithium carbonate 300 MG capsule Take 600 mg by mouth 3 (three) times daily with meals.     [provider]  metoprolol (LOPRESSOR) 100 MG tablet Take 100 mg by mouth 2 (two) times daily.      [provider]  traMADol (ULTRAM) 50 MG tablet Take 1 tablet (50 mg total) by mouth every 6 (six) hours as needed. 09/21/13   Gilda Crease, MD  traMADol (ULTRAM) 50 MG tablet Take 1 tablet (50 mg total) by mouth every 6 (six) hours as  needed. 10/04/13   Santiago Glad, PA-C  traZODone (DESYREL) 50 MG tablet Take 0.5-1 tablets (25-50 mg total) by mouth at bedtime as needed for sleep. 10/19/13   Tyrone Nine, MD    Family History Family History  Problem Relation Age of Onset  . Diabetes Mother   . Hypertension Mother   . Diabetes Father   . Hypertension Father     Social History Social History   Tobacco Use  . Smoking status: Never Smoker  . Smokeless tobacco: Never Used  Substance Use Topics  . Alcohol use: Yes    Comment: occassional wine  . Drug use: No     Allergies   Aspirin; Latex; Penicillins; and Strawberry extract   Review of Systems Review of Systems  Respiratory: Negative for cough.   Cardiovascular: Positive for chest pain. Negative for syncope.  Gastrointestinal: Negative for abdominal pain.  Neurological: Positive for headaches.  All other systems reviewed and are negative.    Physical Exam Updated Vital  Signs BP 131/78   Pulse 87   Temp 99 F (37.2 C) (Oral)   Resp 20   SpO2 99%   Physical Exam  Constitutional: She appears well-developed and well-nourished.  HENT:  Head: Normocephalic and atraumatic.  Eyes: Pupils are equal, round, and reactive to light. EOM are normal.  Neck: Normal range of motion.  Cardiovascular: Normal rate and regular rhythm.  Pulmonary/Chest: Effort normal. No stridor. No respiratory distress.  Abdominal: She exhibits no distension.  Musculoskeletal: Normal range of motion.       Right lower leg: Normal.       Left lower leg: Normal.  Neurological: She is alert.  No altered mental status, able to give full seemingly accurate history.  Face is symmetric, EOM's intact, pupils equal and reactive, vision intact, tongue and uvula midline without deviation. Upper and Lower extremity motor 5/5, intact pain perception in distal extremities, 2+ reflexes in biceps, patella and achilles tendons. Able to perform finger to nose normal with both hands. Walks without assistance or evident ataxia.    Skin: Skin is warm and dry.  Nursing note and vitals reviewed.    ED Treatments / Results  Labs (all labs ordered are listed, but only abnormal results are displayed) Labs Reviewed  BASIC METABOLIC PANEL - Abnormal; Notable for the following components:      Result Value   Glucose, Bld 116 (*)    BUN <5 (*)    All other components within normal limits  CBC - Abnormal; Notable for the following components:   WBC 13.0 (*)    MCH 25.7 (*)    All other components within normal limits  TROPONIN I  I-STAT TROPONIN, ED  I-STAT BETA HCG BLOOD, ED ([facility], [facility], [facility] ONLY)    EKG EKG Interpretation  Date/Time:  Tuesday June 24 2017 17:00:00 EDT Ventricular Rate:  119 PR Interval:  146 QRS Duration: 68 QT Interval:  310 QTC Calculation: 436 R Axis:   9 Text Interpretation:  Sinus tachycardia Anterior infarct , age undetermined Abnormal ECG tachycardia new Confirmed by  Marily Memos [phone]) on 06/24/2017 8:07:19 PM   Radiology Dg Chest 2 View  Result Date: 06/24/2017 CLINICAL DATA:  LEFT chest pain, shortness of breath, facial tightness after getting tattoos this morning. EXAM: CHEST - 2 VIEW COMPARISON:  Chest radiograph October 04, 2013 FINDINGS: Cardiomediastinal silhouette is normal. No pleural effusions or focal consolidations. Trachea projects midline and there is no pneumothorax. Soft tissue planes and included osseous  structures are non-suspicious. IMPRESSION: Negative. Electronically Signed   By: Awilda Metro M.D.   On: 06/24/2017 17:52   Ct Head Wo Contrast  Result Date: 06/24/2017 CLINICAL DATA:  Headache, left-sided numbness/tingling EXAM: CT HEAD WITHOUT CONTRAST TECHNIQUE: Contiguous axial images were obtained from the base of the skull through the vertex without intravenous contrast. COMPARISON:  None. FINDINGS: Brain: No evidence of acute infarction, hemorrhage, hydrocephalus, extra-axial collection or mass lesion/mass effect. 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 Vascular: No hyperdense vessel or unexpected calcification. Skull: Normal. Negative for fracture or focal lesion. Sinuses/Orbits: The visualized paranasal sinuses are essentially clear. The mastoid air cells are unopacified. Other: None. IMPRESSION: No evidence of acute intracranial abnormality. Electronically Signed   By: Charline Bills M.D.   On: 06/24/2017 22:23    Procedures Procedures (including critical care time)  Medications Ordered in ED Medications  metoCLOPramide (REGLAN) injection 10 mg (10 mg Intravenous Given 06/24/17 2218)  diphenhydrAMINE (BENADRYL) injection 25 mg (25 mg Intravenous Given 06/24/17 2217)  dexamethasone (DECADRON) injection 10 mg (10 mg Intravenous Given 06/24/17 2218)  lactated ringers bolus 1,000 mL  (0 mLs Intravenous Stopped 06/24/17 2306)     Initial Impression / Assessment and Plan / ED Course  I have reviewed the triage vital signs and the nursing notes.  Pertinent labs & imaging results that were available during my care of the patient were reviewed by me and considered in my medical decision making (see chart for details).     Suspect symptomatic hypertension. Workup negative for any end organ damage. Symptoms improved. Delta troponin negative. Stable for discharge when less sleepy.  Final Clinical Impressions(s) / ED Diagnoses   Final diagnoses:  Nonspecific chest pain  Hypertension, unspecified type    ED Discharge Orders    None       Jahsir Rama, Barbara Cower, MD 06/24/17 1610    Marily Memos, MD 06/24/17 2329

## 2017-06-24 NOTE — ED Notes (Signed)
Patient transported to CT 

## 2018-11-16 IMAGING — CT CT HEAD W/O CM
3 series · 16 of 47 positions shown, 19 images · non-contrast
Comparison: None.

CLINICAL DATA: Headache, left-sided numbness/tingling

EXAM:
CT HEAD WITHOUT CONTRAST
TECHNIQUE: Contiguous axial images were obtained from the base of the skull
through the vertex without intravenous contrast.

[Series 3: head 5.0 h30s · axial · 0.43mm/px · z∈[+1248,+1393]mm · 10 of 35 slices shown, 13 images]
[im 3/35  brain]
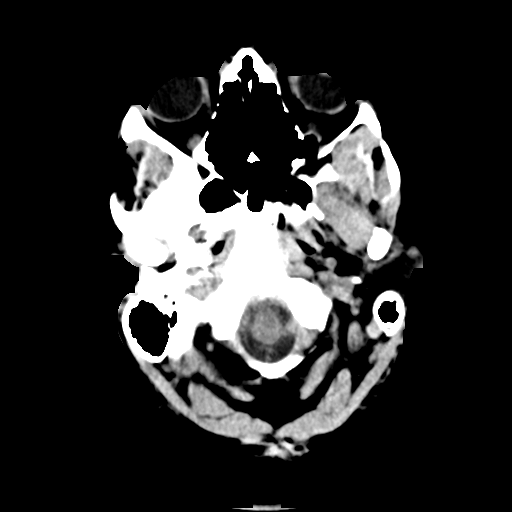
[im 3/35  bone]
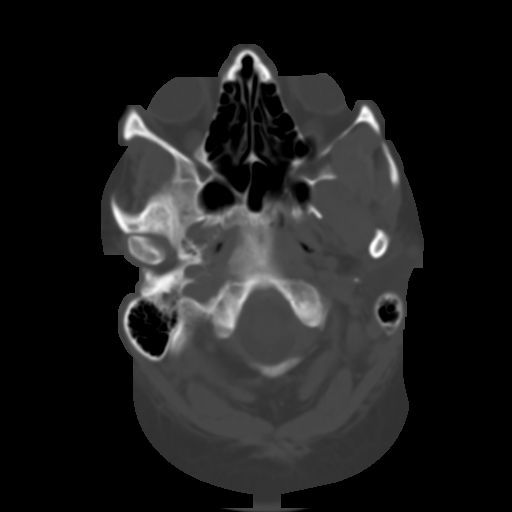
[im 6/35  brain]
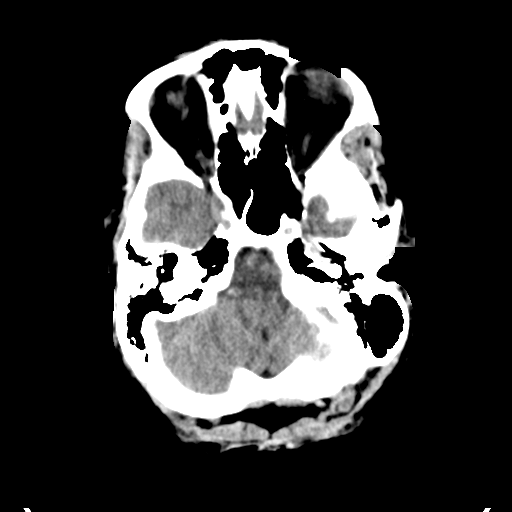
[im 10/35  brain]
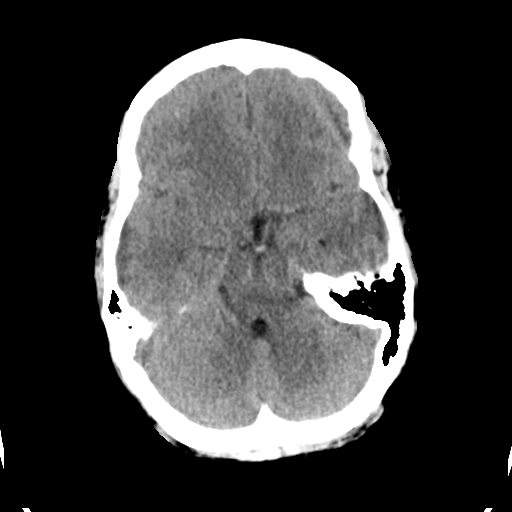
[im 12/35  brain]
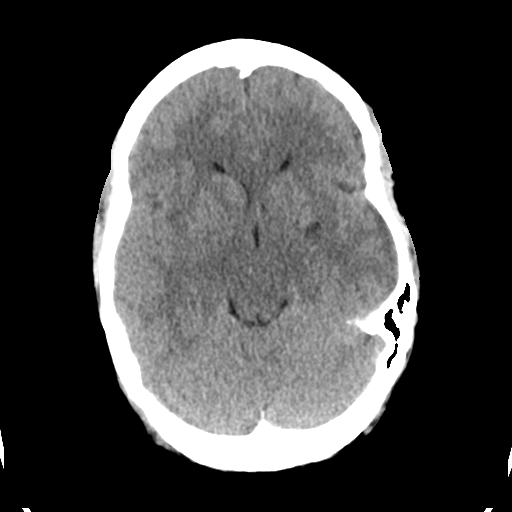
[im 16/35  brain]
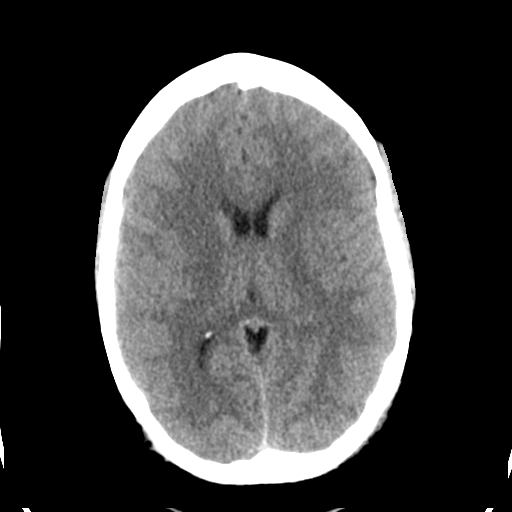
[im 16/35  bone]
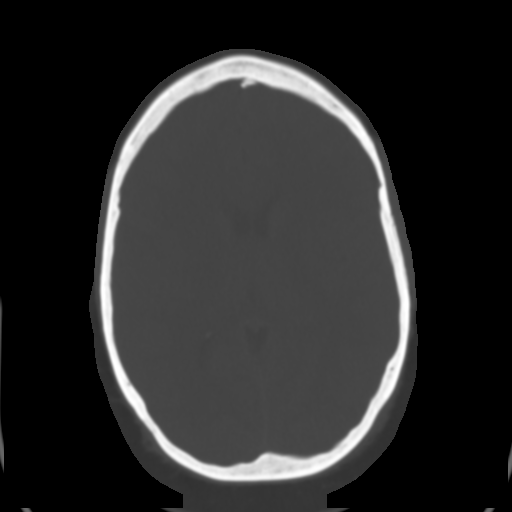
[im 19/35  brain]
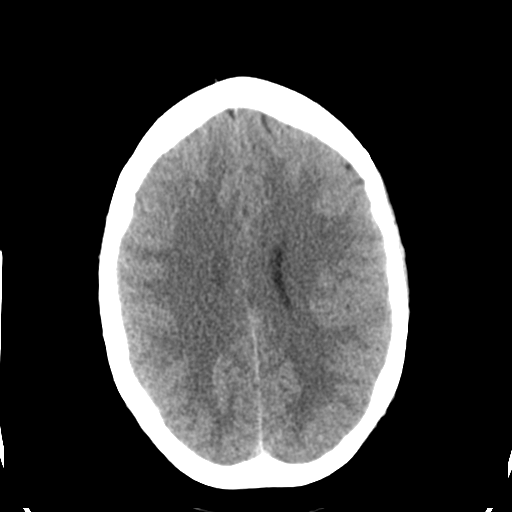
[im 23/35  brain]
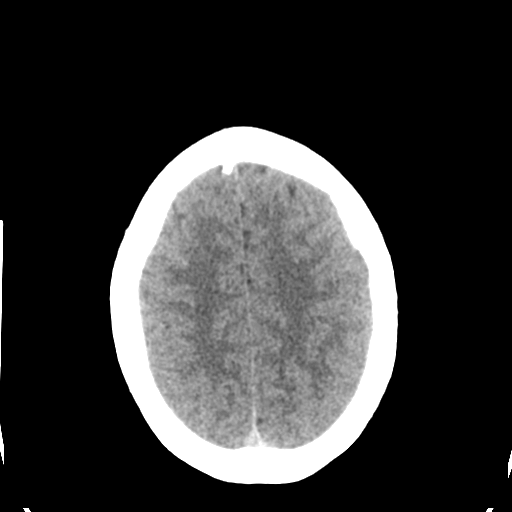
[im 26/35  brain]
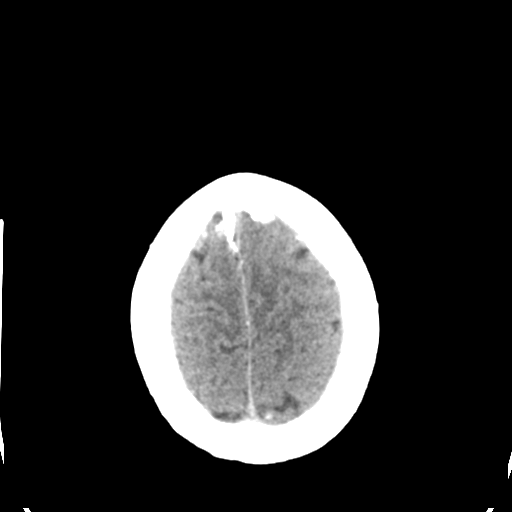
[im 29/35  brain]
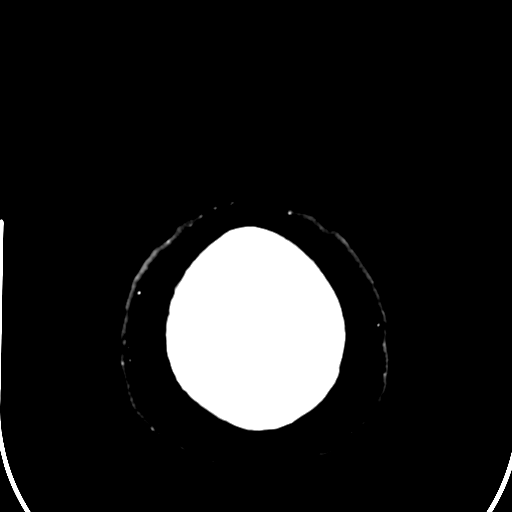
[im 29/35  bone]
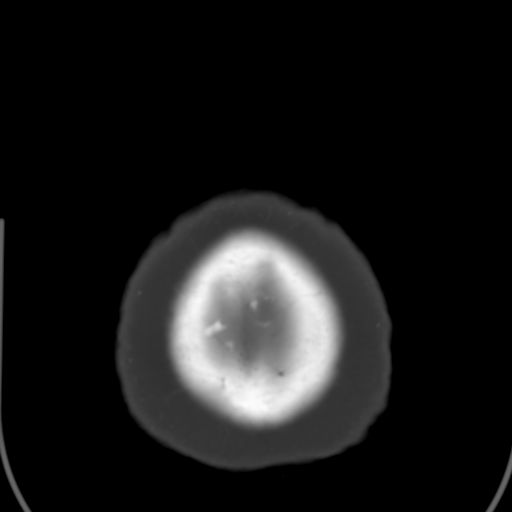
[im 32/35  brain]
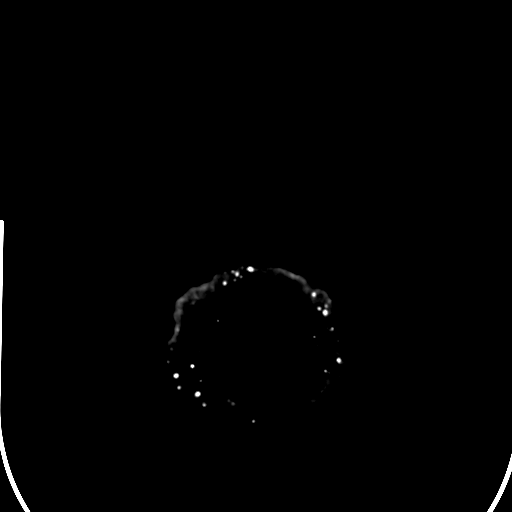

[Series 5: head 3.0 mpr cor · coronal · 0.29mm/px · 3 of 67 slices shown]
[im 23/67  brain]
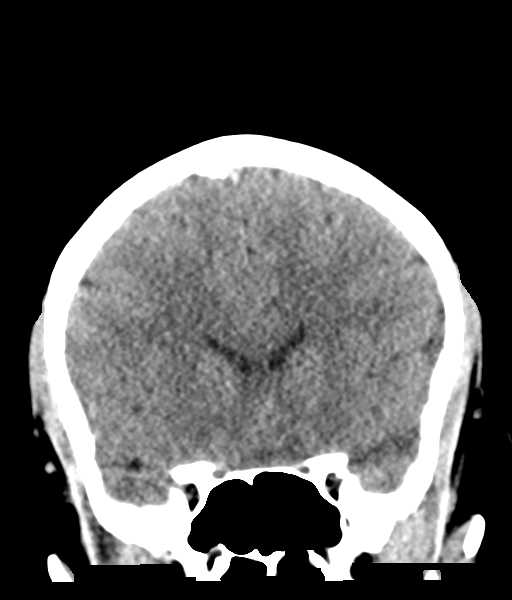
[im 30/67  brain]
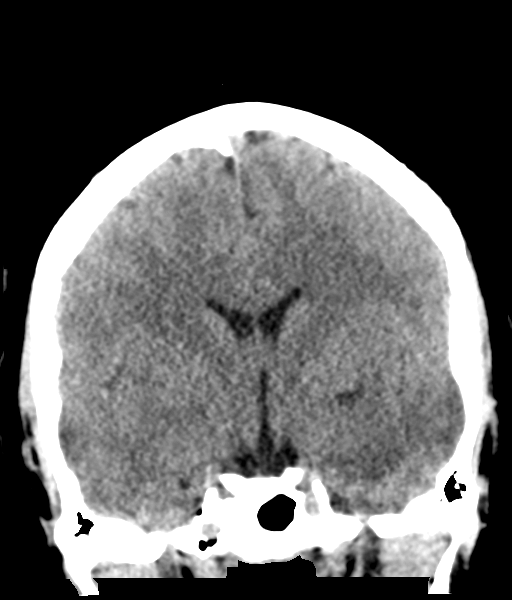
[im 37/67  brain]
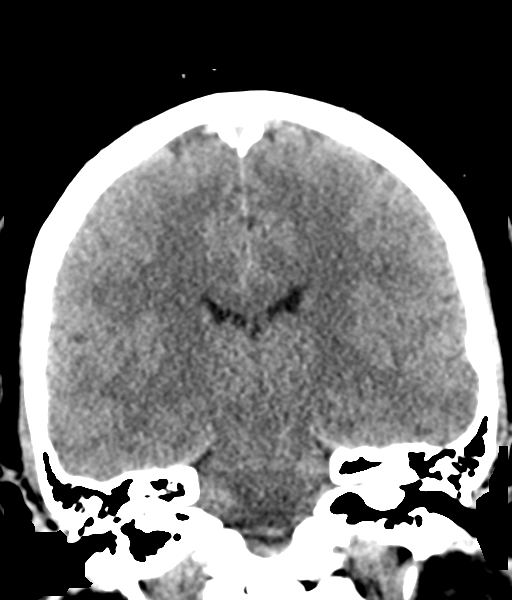

[Series 6: head 3.0 mpr sag · sagittal · 0.33mm/px · 3 of 51 slices shown]
[im 17/51  brain]
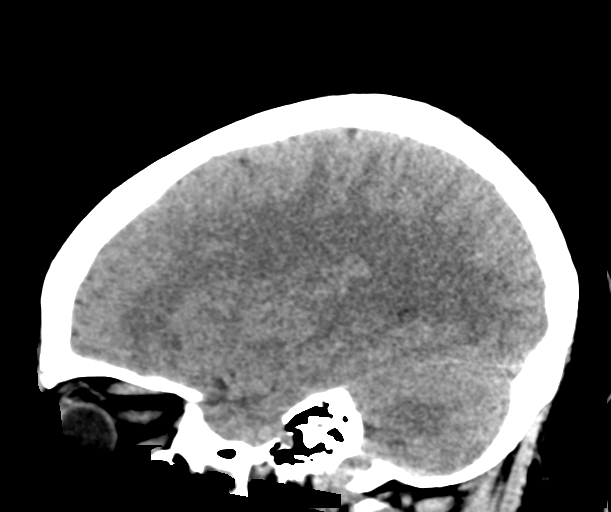
[im 26/51  brain]
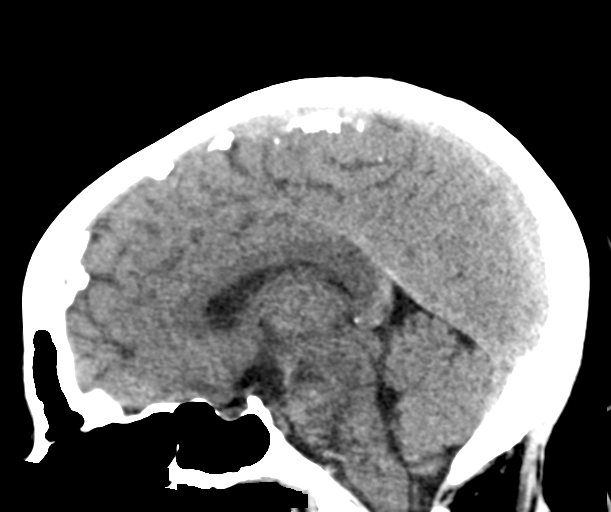
[im 34/51  brain]
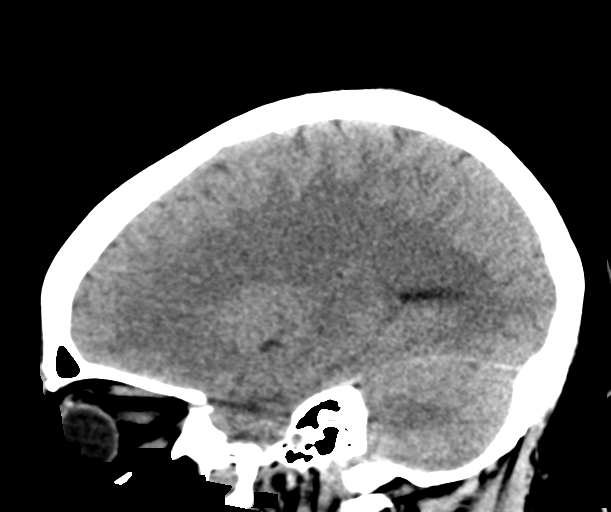

[16 of 47 positions shown; findings below may reference images not displayed]

FINDINGS: Brain: No evidence of acute infarction, hemorrhage, hydrocephalus,
extra-axial collection or mass lesion/mass effect.

Stable cystic/low-density lesion within the infundibular recess of
the 3rd ventricle (sagittal image 27), likely reflecting a benign
arachnoid cyst although better evaluated on prior MRI.

Vascular: No hyperdense vessel or unexpected calcification.

Skull: Normal. Negative for fracture or focal lesion.

Sinuses/Orbits: The visualized paranasal sinuses are essentially
clear. The mastoid air cells are unopacified.

Other: None.
IMPRESSION: No evidence of acute intracranial abnormality.

## 2019-11-11 ENCOUNTER — Inpatient Hospital Stay
Admit: 2019-11-11 | Discharge: 2019-11-11 | Disposition: A | Discharge status: Home / Self Care | Payer: MEDICARE | Attending: Emergency Medicine

## 2019-11-11 DIAGNOSIS — H60501 Unspecified acute noninfective otitis externa, right ear: Secondary | ICD-10-CM

## 2019-11-11 MED ORDER — DOXYCYCLINE HYCLATE 100 MG TAB
100 mg | ORAL_TABLET | Freq: Two times a day (BID) | ORAL | 0 refills | Status: AC
Start: 2019-11-11 — End: 2019-11-18

## 2019-11-11 MED ORDER — DOXYCYCLINE 100 MG CAP
100 mg | ORAL | Status: AC
Start: 2019-11-11 — End: 2019-11-11
  Administered 2019-11-11: 17:00:00 via ORAL

## 2019-11-11 MED ORDER — IBUPROFEN 800 MG TAB
800 mg | Freq: Once | ORAL | Status: AC
Start: 2019-11-11 — End: 2019-11-11
  Administered 2019-11-11: 17:00:00 via ORAL

## 2019-11-11 MED ORDER — NEOMYCIN-POLYMYXIN-HC 3.5 MG-10,000 UNIT/ML-1 % EAR DROPS, SUSP
3.5-10000-1 mg/mL-unit/mL-% | Freq: Four times a day (QID) | OTIC | 1 refills | Status: AC
Start: 2019-11-11 — End: ?

## 2019-11-11 MED FILL — IBUPROFEN 800 MG TAB: 800 mg | ORAL | Qty: 1

## 2019-11-11 MED FILL — DOXYCYCLINE 100 MG CAP: 100 mg | ORAL | Qty: 1

## 2019-11-11 NOTE — ED Notes (Signed)
.    Chief Complaint   Patient presents with   . Ear Pain     pt presents with right sided ear pain that has been ongoing for 3 days, states getting worse today with dimished hearing in right ear. Pt states that left ear is starting to feel sore now

## 2019-11-11 NOTE — ED Provider Notes (Signed)
ED Provider Notes by Addison LankSmith, Kiandra Sanguinetti D, MD at 11/11/19 1254                Author: Addison LankSmith, Izabellah Dadisman D, MD  Service: --  Author Type: Physician       Filed: 11/11/19 1258  Date of Service: 11/11/19 1254  Status: Signed          Editor: Addison LankSmith, Makina Skow D, MD (Physician)               EMERGENCY DEPARTMENT HISTORY AND PHYSICAL EXAM           Date: 11/11/2019   Patient Name: Summer Walters        History of Presenting Illness          Chief Complaint       Patient presents with        ?  Ear Pain             pt presents with right sided ear pain that has been ongoing for 3 days, states getting worse today with dimished hearing in right ear. Pt states  that left ear is starting to feel sore now           History Provided By: Patient      HPI: Summer Walters,  49 y.o. female with a past medical history significant  No significant past medical history presents to the ED with cc of 3 days of worsening right ear pain and pain with movement of ear.      There are no other complaints, changes, or physical findings at this time.      PCP: None        No current facility-administered medications on file prior to encounter.          No current outpatient medications on file prior to encounter.             Past History        Past Medical History:   History reviewed. No pertinent past medical history.      Past Surgical History:   History reviewed. No pertinent surgical history.      Family History:   History reviewed. No pertinent family history.      Social History:     Social History          Tobacco Use         ?  Smoking status:  Never Smoker     ?  Smokeless tobacco:  Never Used       Substance Use Topics         ?  Alcohol use:  Not on file         ?  Drug use:  Not on file           Allergies:     Allergies        Allergen  Reactions         ?  Latex  Itching     ?  Aspirin  Itching     ?  Penicillins  Hives         ?  Sulfa (Sulfonamide Antibiotics)  Hives                Review of Systems     Review of all other systems negative    Review of Systems        Physical Exam     Pleasant middle-aged AA female no acute distress   Physical Exam  Vitals and nursing note reviewed.   Constitutional:        General: She is not in acute distress.     Appearance: Normal appearance. She is not ill-appearing or toxic-appearing.    HENT:       Head: Normocephalic and atraumatic.      Right Ear: Tympanic membrane normal.      Left Ear: Tympanic membrane normal.      Ears:      Comments: Positive tragal tenderness the  right external canal is swollen with yellowish discharge TM is partially visualized without inflammation; there is mild tenderness also of the left ear though no visible inflammation the external canal; palpable preauricular lymph node tender      Nose: Nose normal.      Mouth/Throat:      Mouth: Mucous membranes are moist.      Pharynx: No posterior oropharyngeal erythema.   Eyes:       Extraocular Movements: Extraocular movements intact.      Conjunctiva/sclera: Conjunctivae normal.      Pupils: Pupils are equal, round, and reactive to light.    Musculoskeletal:      Cervical back: Normal range of motion and neck supple. No rigidity or tenderness.     Neurological:       Mental Status: She is alert.              Lab and Diagnostic Study Results        Labs -    No results found for this or any previous visit (from the past 12 hour(s)).      Radiologic Studies -    @lastxrresult @     CT Results   (Last 48 hours)          None                 CXR Results   (Last 48 hours)          None                       Medical Decision Making     - I am the first provider for this patient.      - I reviewed the vital signs, available nursing notes, past medical history, past surgical history, family history and social history.      - Initial assessment performed. The patients presenting problems have been discussed, and they are in agreement with the care plan formulated and outlined with them.  I have encouraged them to ask questions as they arise  throughout their visit.      Vital Signs-Reviewed the patient's vital signs.   Patient Vitals for the past 12 hrs:            Temp  Pulse  Resp  BP  SpO2            11/11/19 1226  98.5 ??F (36.9 ??C)  82  20  126/80  98 %           Records Reviewed: Nursing Notes and Old Medical Records      The patient presents with earache with a differential diagnosis of otitis externa otitis media cerumen impaction         ED Course:              Provider Notes (Medical Decision Making):    External otitis   MDM  Procedures     Medical Decision Makingedical Decision Making   Performed by: Addison Lank, MD   PROCEDURES:   Procedures            Disposition     Disposition: Condition unchanged and stable   DC- Adult Discharges: All of the diagnostic tests were reviewed and questions answered. Diagnosis, care plan and treatment options were discussed.  The patient understands the instructions and will follow up as directed. The patients results have been  reviewed with them.  They have been counseled regarding their diagnosis.  The patient verbally convey understanding and agreement of the signs, symptoms, diagnosis, treatment and prognosis and additionally agrees to follow up as recommended with their  PCP in 24 - 48 hours.  They also agree with the care-plan and convey that all of their questions have been answered.  I have also put together some discharge instructions for them that include: 1) educational information regarding their diagnosis, 2)  how to care for their diagnosis at home, as well a 3) list of reasons why they would want to return to the ED prior to their follow-up appointment, should their condition change.      Discharged      DISCHARGE PLAN:   1.      Current Discharge Medication List              START taking these medications          Details        neomycin-polymyxin-hydrocortisone, buffered, (PEDIOTIC) 3.5-10,000-1 mg/mL-unit/mL-% otic suspension  Administer 3 Drops in right ear four (4) times  daily.   Qty: 10 mL, Refills: 1               doxycycline (VIBRA-TABS) 100 mg tablet  Take 1 Tablet by mouth two (2) times a day for 7 days.   Qty: 14 Tablet, Refills: 0                      2.      Follow-up Information               Follow up With  Specialties  Details  Why  Contact Info              Doug Sou, MD  Family Medicine  Go in 3 days    [address]   [place] [address]   [phone]                3.  Return to ED if worse    4.      Current Discharge Medication List              START taking these medications          Details        neomycin-polymyxin-hydrocortisone, buffered, (PEDIOTIC) 3.5-10,000-1 mg/mL-unit/mL-% otic suspension  Administer 3 Drops in right ear four (4) times daily.   Qty: 10 mL, Refills:  1   Start date: 11/11/2019               doxycycline (VIBRA-TABS) 100 mg tablet  Take 1 Tablet by mouth two (2) times a day for 7 days.   Qty: 14 Tablet, Refills:  0   Start date: 11/11/2019, End date:  11/18/2019                              Diagnosis  Clinical Impression:       1.  Acute otitis externa of right ear, unspecified type            Attestations:      Addison Lank, MD      Please note that this dictation was completed with Dragon, the computer voice recognition software.  Quite often unanticipated grammatical, syntax, homophones, and other interpretive errors are inadvertently  transcribed by the computer software.  Please disregard these errors.  Please excuse any errors that have escaped final proofreading.  Thank you.

## 2023-04-27 ENCOUNTER — Inpatient Hospital Stay
Admit: 2023-04-27 | Discharge: 2023-04-27 | Disposition: A | Discharge status: Home / Self Care | Payer: PRIVATE HEALTH INSURANCE | Attending: Emergency Medicine

## 2023-04-27 ENCOUNTER — Emergency Department: Admit: 2023-04-27 | Payer: PRIVATE HEALTH INSURANCE

## 2023-04-27 DIAGNOSIS — J101 Influenza due to other identified influenza virus with other respiratory manifestations: Secondary | ICD-10-CM

## 2023-04-27 LAB — COMPREHENSIVE METABOLIC PANEL
ALT: 46 U/L (ref 12–78)
AST: 42 U/L — ABNORMAL HIGH (ref 15–37)
Albumin/Globulin Ratio: 0.8 — ABNORMAL LOW (ref 1.1–2.2)
Albumin: 3.6 g/dL (ref 3.5–5.0)
Alk Phosphatase: 126 U/L — ABNORMAL HIGH (ref 45–117)
Anion Gap: 5 mmol/L (ref 2–12)
BUN/Creatinine Ratio: 11 — ABNORMAL LOW (ref 12–20)
BUN: 12 mg/dL (ref 6–20)
CO2: 24 mmol/L (ref 21–32)
Calcium: 9.7 mg/dL (ref 8.5–10.1)
Chloride: 107 mmol/L (ref 97–108)
Creatinine: 1.08 mg/dL — ABNORMAL HIGH (ref 0.55–1.02)
Est, Glom Filt Rate: 62 mL/min/{1.73_m2} (ref >60)
Globulin: 4.8 g/dL — ABNORMAL HIGH (ref 2.0–4.0)
Glucose: 88 mg/dL (ref 65–100)
Potassium: 4 mmol/L (ref 3.5–5.1)
Sodium: 136 mmol/L (ref 136–145)
Total Bilirubin: 0.3 mg/dL (ref 0.2–1.0)
Total Protein: 8.4 g/dL — ABNORMAL HIGH (ref 6.4–8.2)

## 2023-04-27 LAB — CBC WITH AUTO DIFFERENTIAL
Basophils %: 0.4 % (ref 0.0–1.0)
Basophils Absolute: 0.02 10*3/uL (ref 0.00–0.10)
Eosinophils %: 0.2 % (ref 0.0–7.0)
Eosinophils Absolute: 0.01 10*3/uL (ref 0.00–0.40)
Hematocrit: 42.3 % (ref 35.0–47.0)
Hemoglobin: 13.1 g/dL (ref 11.5–16.0)
Immature Granulocytes %: 0.4 % (ref 0–0.5)
Immature Granulocytes Absolute: 0.02 10*3/uL (ref 0.00–0.04)
Lymphocytes %: 32.4 % (ref 12.0–49.0)
Lymphocytes Absolute: 1.84 10*3/uL (ref 0.80–3.50)
MCH: 24.2 pg — ABNORMAL LOW (ref 26.0–34.0)
MCHC: 31 g/dL (ref 30.0–36.5)
MCV: 78.2 FL — ABNORMAL LOW (ref 80.0–99.0)
MPV: 10.1 FL (ref 8.9–12.9)
Monocytes %: 10.4 % (ref 5.0–13.0)
Monocytes Absolute: 0.59 10*3/uL (ref 0.00–1.00)
Neutrophils %: 56.2 % (ref 32.0–75.0)
Neutrophils Absolute: 3.2 10*3/uL (ref 1.80–8.00)
Nucleated RBCs: 0 /100{WBCs}
Platelets: 319 10*3/uL (ref 150–400)
RBC: 5.41 M/uL — ABNORMAL HIGH (ref 3.80–5.20)
RDW: 16.2 % — ABNORMAL HIGH (ref 11.5–14.5)
WBC: 5.7 10*3/uL (ref 3.6–11.0)
nRBC: 0 10*3/uL (ref 0.00–0.01)

## 2023-04-27 LAB — COVID-19 & INFLUENZA COMBO
Rapid Influenza A By PCR: DETECTED — AB
Rapid Influenza B By PCR: NOT DETECTED
SARS-CoV-2, PCR: NOT DETECTED

## 2023-04-27 LAB — EKG 12-LEAD
Atrial Rate: 80 {beats}/min
Diagnosis: NORMAL
P Axis: 51 degrees
P-R Interval: 158 ms
Q-T Interval: 362 ms
QRS Duration: 76 ms
QTc Calculation (Bazett): 417 ms
R Axis: 256 degrees
T Axis: 35 degrees
Ventricular Rate: 80 {beats}/min

## 2023-04-27 LAB — TROPONIN: Troponin, High Sensitivity: 21 ng/L (ref 0–51)

## 2023-04-27 MED ORDER — KETOROLAC TROMETHAMINE 15 MG/ML IJ SOLN
15 | INTRAMUSCULAR | Status: DC
Start: 2023-04-27 — End: 2023-04-27

## 2023-04-27 MED ORDER — KETOROLAC TROMETHAMINE 10 MG PO TABS
10 | ORAL_TABLET | Freq: Four times a day (QID) | ORAL | 0 refills | Status: AC | PRN
Start: 2023-04-27 — End: ?

## 2023-04-27 MED ORDER — BENZONATATE 100 MG PO CAPS
100 | ORAL_CAPSULE | Freq: Three times a day (TID) | ORAL | 0 refills | Status: AC | PRN
Start: 2023-04-27 — End: 2023-05-07

## 2023-04-27 MED ORDER — KETOROLAC TROMETHAMINE 15 MG/ML IJ SOLN
15 | Freq: Once | INTRAMUSCULAR | Status: AC
Start: 2023-04-27 — End: 2023-04-27
  Administered 2023-04-27: 20:00:00 15 mg via INTRAMUSCULAR

## 2023-04-27 MED FILL — KETOROLAC TROMETHAMINE 15 MG/ML IJ SOLN: 15 MG/ML | INTRAMUSCULAR | Qty: 1

## 2023-04-27 NOTE — Discharge Instructions (Signed)
 Thank you for choosing our Emergency Department for your care.  It is our privilege to care for you in your time of need.  In the next several days, you may receive a survey via email or mailed to your home about your experience with our team.  We would greatly appreciate you taking a few minutes to complete the survey, as we use this information to learn what we have done well and what we could be doing better. Thank you for trusting [place] with your care!    Below you will find a list of your tests from today's visit.   Labs and Radiology Studies  Recent Results (from the past 12 hour(s))   EKG 12 Lead    Collection Time: 04/27/23 10:32 AM   Result Value Ref Range    Ventricular Rate 80 BPM    Atrial Rate 80 BPM    P-R Interval 158 ms    QRS Duration 76 ms    Q-T Interval 362 ms    QTc Calculation (Bazett) 417 ms    P Axis 51 degrees    R Axis 256 degrees    T Axis 35 degrees    Diagnosis       Normal sinus rhythm  Right superior axis deviation  Pulmonary disease pattern  Abnormal ECG  When compared with ECG of 05-Jul-2018 12:35,  QRS axis Shifted left  Criteria for Septal infarct are no longer Present  Confirmed by SPENCER MD, BEVERLY ([id number]) on 04/27/2023 11:30:33 AM     CBC with Auto Differential    Collection Time: 04/27/23 10:37 AM   Result Value Ref Range    WBC 5.7 3.6 - 11.0 K/uL    RBC 5.41 (H) 3.80 - 5.20 M/uL    Hemoglobin 13.1 11.5 - 16.0 g/dL    Hematocrit 16.1 09.6 - 47.0 %    MCV 78.2 (L) 80.0 - 99.0 FL    MCH 24.2 (L) 26.0 - 34.0 PG    MCHC 31.0 30.0 - 36.5 g/dL    RDW 04.5 (H) 40.9 - 14.5 %    Platelets 319 150 - 400 K/uL    MPV 10.1 8.9 - 12.9 FL    Nucleated RBCs 0.0 0.0 PER 100 WBC    nRBC 0.00 0.00 - 0.01 K/uL    Neutrophils % 56.2 32.0 - 75.0 %    Lymphocytes % 32.4 12.0 - 49.0 %    Monocytes % 10.4 5.0 - 13.0 %    Eosinophils % 0.2 0.0 - 7.0 %    Basophils % 0.4 0.0 - 1.0 %    Immature Granulocytes % 0.4 0 - 0.5 %    Neutrophils Absolute 3.20 1.80 - 8.00 K/UL    Lymphocytes Absolute 1.84 0.80 -  3.50 K/UL    Monocytes Absolute 0.59 0.00 - 1.00 K/UL    Eosinophils Absolute 0.01 0.00 - 0.40 K/UL    Basophils Absolute 0.02 0.00 - 0.10 K/UL    Immature Granulocytes Absolute 0.02 0.00 - 0.04 K/UL    Differential Type AUTOMATED     Comprehensive Metabolic Panel    Collection Time: 04/27/23 10:37 AM   Result Value Ref Range    Sodium 136 136 - 145 mmol/L    Potassium 4.0 3.5 - 5.1 mmol/L    Chloride 107 97 - 108 mmol/L    CO2 24 21 - 32 mmol/L    Anion Gap 5 2 - 12 mmol/L    Glucose 88 65 - 100 mg/dL  BUN 12 6 - 20 mg/dL    Creatinine 2.53 (H) 0.55 - 1.02 mg/dL    BUN/Creatinine Ratio 11 (L) 12 - 20      Est, Glom Filt Rate 62 >60 ml/min/1.66m2    Calcium 9.7 8.5 - 10.1 mg/dL    Total Bilirubin 0.3 0.2 - 1.0 mg/dL    AST 42 (H) 15 - 37 U/L    ALT 46 12 - 78 U/L    Alk Phosphatase 126 (H) 45 - 117 U/L    Total Protein 8.4 (H) 6.4 - 8.2 g/dL    Albumin 3.6 3.5 - 5.0 g/dL    Globulin 4.8 (H) 2.0 - 4.0 g/dL    Albumin/Globulin Ratio 0.8 (L) 1.1 - 2.2     Troponin    Collection Time: 04/27/23 10:37 AM   Result Value Ref Range    Troponin, High Sensitivity 21 0 - 51 ng/L   COVID-19 & Influenza Combo    Collection Time: 04/27/23 10:37 AM    Specimen: Nasopharyngeal   Result Value Ref Range    SARS-CoV-2, PCR Not Detected Not Detected      Rapid Influenza A By PCR DETECTED (A) Not Detected      Rapid Influenza B By PCR Not Detected Not Detected       XR CHEST PORTABLE    Result Date: 04/27/2023  EXAM:  XR CHEST PORTABLE INDICATION: cough COMPARISON: none TECHNIQUE: portable chest AP view FINDINGS: The cardiac silhouette is within normal limits. The pulmonary vasculature is within normal limits. The lungs and pleural spaces are clear. The visualized bones and upper abdomen are age-appropriate.     No acute process on portable chest. Electronically signed by Michaelle Birks, MD    ------------------------------------------------------------------------------------------------------------  The evaluation and treatment you  received in the Emergency Department were for an urgent problem. It is important that you follow-up with a doctor, nurse practitioner, or physician assistant to:  (1) confirm your diagnosis,  (2) re-evaluation of changes in your illness and treatment, and (3) for ongoing care. Please take your discharge instructions with you when you go to your follow-up appointment.     If you have any problem arranging a follow-up appointment, contact us!  If your symptoms become worse or you do not improve as expected, please return to [place]. We are available 24 hours a day.     If a prescription has been provided, please fill it as soon as possible to prevent a delay in treatment. If you have any questions or reservations about taking the medication due to side effects or interactions with other medications, please call your primary care provider or contact us directly.  Again, THANK YOU for choosing [place] to care for YOU!

## 2023-04-27 NOTE — ED Notes (Signed)
 Report given to Alba Cory., RN

## 2023-04-27 NOTE — ED Triage Notes (Signed)
 Pt complains of chest pain and cough and fevers that started 4 days ago. Denies SOB.

## 2023-04-27 NOTE — ED Provider Notes (Signed)
 [facility]  EMERGENCY DEPARTMENT HISTORY AND PHYSICAL EXAM      Date: 04/27/2023  Patient Name: Summer Walters  MRN: [id number]  Birthdate [birth year]  Date of evaluation: 04/27/2023  Provider: Cecil Cranker, MD   Note Started: 2:25 PM EST 04/27/23    HISTORY OF PRESENT ILLNESS     Chief Complaint   Patient presents with    Cough    Chest Pain       History Provided By: Patient and EMS    HPI: Summer Walters is a 53 y.o. female presents for evaluation of what is history of cough and fevers with associated chest pain.  Patient denies shortness of breath.  Patient denies nausea vomiting or diarrhea.    PAST MEDICAL HISTORY   Past Medical History:  Past Medical History:   Diagnosis Date    Hypertension     Intracranial hypertension     TBI (traumatic brain injury) [facility])        Past Surgical History:  History reviewed. No pertinent surgical history.    Family History:  History reviewed. No pertinent family history.    Social History:  Social History     Tobacco Use    Smoking status: Never    Smokeless tobacco: Never       Allergies:  Allergies   Allergen Reactions    Latex Itching    Aspirin Itching    Penicillins Hives    Sulfa Antibiotics Hives       PCP: No primary care provider on file.    Current Meds:   No current facility-administered medications for this encounter.     Current Outpatient Medications   Medication Sig Dispense Refill    ketorolac (TORADOL) 10 MG tablet Take 1 tablet by mouth every 6 hours as needed for Pain 20 tablet 0    benzonatate (TESSALON) 100 MG capsule Take 1 capsule by mouth 3 times daily as needed for Cough 30 capsule 0    neomycin-polymyxin-hydrocortisone (CORTISPORIN) 3.5-10000-1 otic suspension Place 3 drops in ear(s) 4 times daily         Social Determinants of Health:   Social Determinants of Health     Tobacco Use: Low Risk  (04/27/2023)    Patient History     Smoking Tobacco Use: Never     Smokeless Tobacco Use: Never     Passive Exposure: Not on file   Alcohol Use: Not At Risk  (04/27/2023)    AUDIT-C     Frequency of Alcohol Consumption: Never     Average Number of Drinks: Patient does not drink     Frequency of Binge Drinking: Never   Financial Resource Strain: Not on file   Food Insecurity: Not on file   Transportation Needs: Not on file   Physical Activity: Not on file   Stress: Not on file   Social Connections: Not on file   Intimate Partner Violence: Not on file   Depression: Not on file   Housing Stability: Not on file   Interpersonal Safety: Not At Risk (04/27/2023)    Interpersonal Safety Domain Source: IP Abuse Screening     Physical abuse: Denies     Verbal abuse: Denies     Emotional abuse: Denies     Financial abuse: Denies     Sexual abuse: Denies   Utilities: Not on file       PHYSICAL EXAM   Physical Exam  Physical Exam  Constitutional:  General: No acute distress.     Appearance: Normal appearance. Not toxic-appearing.   HENT:      Head: Normocephalic and atraumatic.      Nose: Nose normal.      Mouth/Throat:      Mouth: Mucous membranes are moist.   Eyes:      Extraocular Movements: Extraocular movements intact.      Pupils: Pupils are equal, round, and reactive to light.   Cardiovascular:      Rate and Rhythm: Normal rate.      Pulses: Normal pulses.   Pulmonary:      Effort: Pulmonary effort is normal.      Breath sounds: No stridor, clear to auscultation bilaterally  Abdominal:      General: Abdomen is flat. There is no distension.   Musculoskeletal:         General: Normal range of motion.      Cervical back: Normal range of motion and neck supple.   Skin:     General: Skin is warm and dry.      Capillary Refill: Capillary refill takes less than 2 seconds.   Neurological:      General: No focal deficit present.      Mental Status: Alert and oriented to person, place, and time.          SCREENINGS   History: 0  ECG: 0  Patient Age: 71  Risk Factors: 1  Troponin: 0  Heart Score Total: 2              No data recorded    LAB, EKG AND DIAGNOSTIC RESULTS   Labs:  Recent  Results (from the past 12 hour(s))   EKG 12 Lead    Collection Time: 04/27/23 10:32 AM   Result Value Ref Range    Ventricular Rate 80 BPM    Atrial Rate 80 BPM    P-R Interval 158 ms    QRS Duration 76 ms    Q-T Interval 362 ms    QTc Calculation (Bazett) 417 ms    P Axis 51 degrees    R Axis 256 degrees    T Axis 35 degrees    Diagnosis       Normal sinus rhythm  Right superior axis deviation  Pulmonary disease pattern  Abnormal ECG  When compared with ECG of 05-Jul-2018 12:35,  QRS axis Shifted left  Criteria for Septal infarct are no longer Present  Confirmed by SPENCER MD, BEVERLY ([id number]) on 04/27/2023 11:30:33 AM     CBC with Auto Differential    Collection Time: 04/27/23 10:37 AM   Result Value Ref Range    WBC 5.7 3.6 - 11.0 K/uL    RBC 5.41 (H) 3.80 - 5.20 M/uL    Hemoglobin 13.1 11.5 - 16.0 g/dL    Hematocrit 19.1 47.8 - 47.0 %    MCV 78.2 (L) 80.0 - 99.0 FL    MCH 24.2 (L) 26.0 - 34.0 PG    MCHC 31.0 30.0 - 36.5 g/dL    RDW 29.5 (H) 62.1 - 14.5 %    Platelets 319 150 - 400 K/uL    MPV 10.1 8.9 - 12.9 FL    Nucleated RBCs 0.0 0.0 PER 100 WBC    nRBC 0.00 0.00 - 0.01 K/uL    Neutrophils % 56.2 32.0 - 75.0 %    Lymphocytes % 32.4 12.0 - 49.0 %    Monocytes % 10.4 5.0 - 13.0 %  Eosinophils % 0.2 0.0 - 7.0 %    Basophils % 0.4 0.0 - 1.0 %    Immature Granulocytes % 0.4 0 - 0.5 %    Neutrophils Absolute 3.20 1.80 - 8.00 K/UL    Lymphocytes Absolute 1.84 0.80 - 3.50 K/UL    Monocytes Absolute 0.59 0.00 - 1.00 K/UL    Eosinophils Absolute 0.01 0.00 - 0.40 K/UL    Basophils Absolute 0.02 0.00 - 0.10 K/UL    Immature Granulocytes Absolute 0.02 0.00 - 0.04 K/UL    Differential Type AUTOMATED     Comprehensive Metabolic Panel    Collection Time: 04/27/23 10:37 AM   Result Value Ref Range    Sodium 136 136 - 145 mmol/L    Potassium 4.0 3.5 - 5.1 mmol/L    Chloride 107 97 - 108 mmol/L    CO2 24 21 - 32 mmol/L    Anion Gap 5 2 - 12 mmol/L    Glucose 88 65 - 100 mg/dL    BUN 12 6 - 20 mg/dL    Creatinine 1.61 (H) 0.55 -  1.02 mg/dL    BUN/Creatinine Ratio 11 (L) 12 - 20      Est, Glom Filt Rate 62 >60 ml/min/1.72m2    Calcium 9.7 8.5 - 10.1 mg/dL    Total Bilirubin 0.3 0.2 - 1.0 mg/dL    AST 42 (H) 15 - 37 U/L    ALT 46 12 - 78 U/L    Alk Phosphatase 126 (H) 45 - 117 U/L    Total Protein 8.4 (H) 6.4 - 8.2 g/dL    Albumin 3.6 3.5 - 5.0 g/dL    Globulin 4.8 (H) 2.0 - 4.0 g/dL    Albumin/Globulin Ratio 0.8 (L) 1.1 - 2.2     Troponin    Collection Time: 04/27/23 10:37 AM   Result Value Ref Range    Troponin, High Sensitivity 21 0 - 51 ng/L   COVID-19 & Influenza Combo    Collection Time: 04/27/23 10:37 AM    Specimen: Nasopharyngeal   Result Value Ref Range    SARS-CoV-2, PCR Not Detected Not Detected      Rapid Influenza A By PCR DETECTED (A) Not Detected      Rapid Influenza B By PCR Not Detected Not Detected         EKG:.EKG interpreted by me. Shows Normal Sinus Rhythm with a HR of 80 bpm.  Right superior axis deviation.  Pulmonary disease pattern.  Abnormal EKG.  No STEMI.    Radiologic Studies:  Non-plain film images such as CT, Ultrasound and MRI are read by the radiologist. Plain radiographic images are visualized and preliminarily interpreted by the ED Provider with the following findings: Not Applicable.    Interpretation per the Radiologist below, if available at the time of this note:  XR CHEST PORTABLE   Final Result      No acute process on portable chest.         Electronically signed by Michaelle Birks, MD           ED COURSE and DIFFERENTIAL DIAGNOSIS/MDM   2:26 PM Differential and Considerations: Patient presents for evaluation of flulike symptoms with associated chest pain.  Will evaluate for ACS or cardiac dysrhythmia, versus hemoglobin or metabolic or renal abnormality.  Afebrile nontoxic, unlikely serious bacterial infection, more likely viral etiology such as COVID or flu given the prevalence.    Records Reviewed (source and summary of external notes): Prior medical records and  Nursing notes.    Vitals:    Vitals:     04/27/23 1328 04/27/23 1346 04/27/23 1356 04/27/23 1359   BP: (!) 144/96 (!) 140/80  135/86   Pulse: 83  74 79   Resp: 22  18 18    Temp:       TempSrc:       SpO2: 96% 98% 97% 97%   Weight:       Height:            ED COURSE       SEPSIS Reassessment: Patient does NOT meet Sepsis criteria after ED workup    Clinical Management Tools:  Chest Pain: MEDICAL DECISION MAKING:   I considered, but did not perform, additional testing such CT Angiogram, as well as admission or transfer to a higher level of care.     I utilized an evidence-based risk rating tool (CMT) along with my training and experience to weigh the risk of discharge against the risks of further testing, imaging, or hospitalization. At this time, I estimate the risks of additional testing, imaging, or hospitalization to be equal to or greater than the risk of discharge(in the case of discharge home).      The patient's HEART Score is 2. In rare cases, I give patients with HEART Score of 4 the option of discharge, but only when they meet criteria for "Low 4," meaning that HST was used, and the 4 is not from a highly suspicious story, highly suspicious EKG, or positive cardiac enzymes.  In these selected cases, the risk of a "Low 4" is still most likely lower than the risk of admission and further testing/imaging. [id number]    SHARED DECISION MAKING:   I discussed my risk assessment with the patient. The patient understands and consents to the risk of disposition/plan, as well as the risk of uncertainty in estimating outcomes. [id number]          Patient was given the following medications:  Medications   ketorolac (TORADOL) injection 15 mg (15 mg IntraVENous Given 04/27/23 1419)       CONSULTS: See ED Course/MDM for further details.  None     Social Determinants affecting Diagnosis/Treatment: None    Smoking Cessation: Not Applicable    PROCEDURES   Unless otherwise noted above, none  Procedures      CRITICAL CARE TIME   Patient does not meet  Critical Care Time, 0 minutes    ED IMPRESSION     1. Influenza A    2. Chest wall pain          DISPOSITION/PLAN   DISPOSITION Decision To Discharge 04/27/2023 02:18:44 PM   DISPOSITION CONDITION Stable        Discharge Note: The patient is stable for discharge home. The signs, symptoms, diagnosis, and discharge instructions have been discussed, understanding conveyed, and agreed upon. The patient is to follow up as recommended or return to ER should their symptoms worsen.      PATIENT REFERRED TO:  Almeta Monas, MD  [address] [place] [address]  [phone]    Schedule an appointment as soon as possible for a visit           DISCHARGE MEDICATIONS:     Medication List        START taking these medications      benzonatate 100 MG capsule  Commonly known as: TESSALON  Take 1 capsule by mouth 3 times daily as needed for Cough  ketorolac 10 MG tablet  Commonly known as: TORADOL  Take 1 tablet by mouth every 6 hours as needed for Pain            ASK your doctor about these medications      neomycin-polymyxin-hydrocortisone 3.5-10000-1 otic suspension  Commonly known as: CORTISPORIN               Where to Get Your Medications        These medications were sent to [facility] [place], [place] - [address] [place] - [place] [phone] Carmon Ginsberg [phone]  [address] [place], [place] [address]      Phone: [phone]   benzonatate 100 MG capsule  ketorolac 10 MG tablet           DISCONTINUED MEDICATIONS:  Current Discharge Medication List          I am the Primary Clinician of Record. Cecil Cranker, MD (electronically signed)    (Please note that parts of this dictation were completed with voice recognition software. Quite often unanticipated grammatical, syntax, homophones, and other interpretive errors are inadvertently transcribed by the computer software. Please disregards these errors. Please excuse any errors that have escaped final proofreading.)     Cecil Cranker, MD  04/27/23 1426
